# Patient Record
Sex: Female | Born: 1965 | Hispanic: Yes | Marital: Single | Smoking: Former smoker
Health system: Southern US, Community
[De-identification: ages and names within clinical notes are randomized; demographics above are authoritative.]

## PROBLEM LIST (undated history)

## (undated) DIAGNOSIS — K219 Gastro-esophageal reflux disease without esophagitis: Secondary | ICD-10-CM

## (undated) DIAGNOSIS — F329 Major depressive disorder, single episode, unspecified: Secondary | ICD-10-CM

## (undated) DIAGNOSIS — F900 Attention-deficit hyperactivity disorder, predominantly inattentive type: Secondary | ICD-10-CM

## (undated) DIAGNOSIS — D649 Anemia, unspecified: Secondary | ICD-10-CM

## (undated) DIAGNOSIS — F32A Depression, unspecified: Secondary | ICD-10-CM

## (undated) DIAGNOSIS — K589 Irritable bowel syndrome without diarrhea: Secondary | ICD-10-CM

## (undated) HISTORY — DX: Irritable bowel syndrome, unspecified: K58.9

## (undated) HISTORY — DX: Attention-deficit hyperactivity disorder, predominantly inattentive type: F90.0

## (undated) HISTORY — DX: Gastro-esophageal reflux disease without esophagitis: K21.9

## (undated) HISTORY — DX: Depression, unspecified: F32.A

## (undated) HISTORY — DX: Major depressive disorder, single episode, unspecified: F32.9

## (undated) HISTORY — DX: Anemia, unspecified: D64.9

---

## 1997-05-08 ENCOUNTER — Encounter: Admission: RE | Admit: 1997-05-08 | Discharge: 1997-08-06 | Payer: Self-pay | Admitting: Family Medicine

## 1997-09-02 ENCOUNTER — Other Ambulatory Visit: Admission: RE | Admit: 1997-09-02 | Discharge: 1997-09-02 | Payer: Self-pay | Admitting: Obstetrics and Gynecology

## 1998-04-21 ENCOUNTER — Ambulatory Visit (HOSPITAL_BASED_OUTPATIENT_CLINIC_OR_DEPARTMENT_OTHER): Admission: RE | Admit: 1998-04-21 | Discharge: 1998-04-21 | Payer: Self-pay | Admitting: General Surgery

## 1998-10-15 ENCOUNTER — Other Ambulatory Visit: Admission: RE | Admit: 1998-10-15 | Discharge: 1998-10-15 | Payer: Self-pay | Admitting: Obstetrics and Gynecology

## 1998-12-29 ENCOUNTER — Encounter: Admission: RE | Admit: 1998-12-29 | Discharge: 1998-12-29 | Payer: Self-pay | Admitting: Nephrology

## 1998-12-29 ENCOUNTER — Encounter: Payer: Self-pay | Admitting: Obstetrics and Gynecology

## 2000-01-05 ENCOUNTER — Encounter (INDEPENDENT_AMBULATORY_CARE_PROVIDER_SITE_OTHER): Payer: Self-pay | Admitting: *Deleted

## 2000-01-05 ENCOUNTER — Encounter: Admission: RE | Admit: 2000-01-05 | Discharge: 2000-01-05 | Payer: Self-pay | Admitting: Family Medicine

## 2000-01-05 ENCOUNTER — Encounter: Payer: Self-pay | Admitting: Family Medicine

## 2000-04-06 ENCOUNTER — Encounter: Admission: RE | Admit: 2000-04-06 | Discharge: 2000-04-06 | Payer: Self-pay | Admitting: Obstetrics and Gynecology

## 2000-04-06 ENCOUNTER — Encounter: Payer: Self-pay | Admitting: Obstetrics and Gynecology

## 2002-02-28 ENCOUNTER — Other Ambulatory Visit: Admission: RE | Admit: 2002-02-28 | Discharge: 2002-02-28 | Payer: Self-pay | Admitting: Obstetrics and Gynecology

## 2003-03-13 ENCOUNTER — Other Ambulatory Visit: Admission: RE | Admit: 2003-03-13 | Discharge: 2003-03-13 | Payer: Self-pay | Admitting: Obstetrics and Gynecology

## 2003-04-11 ENCOUNTER — Ambulatory Visit (HOSPITAL_COMMUNITY): Admission: RE | Admit: 2003-04-11 | Discharge: 2003-04-11 | Payer: Self-pay | Admitting: Obstetrics and Gynecology

## 2003-04-11 ENCOUNTER — Encounter (INDEPENDENT_AMBULATORY_CARE_PROVIDER_SITE_OTHER): Payer: Self-pay | Admitting: Specialist

## 2003-12-17 ENCOUNTER — Encounter (INDEPENDENT_AMBULATORY_CARE_PROVIDER_SITE_OTHER): Payer: Self-pay | Admitting: *Deleted

## 2004-04-13 ENCOUNTER — Other Ambulatory Visit: Admission: RE | Admit: 2004-04-13 | Discharge: 2004-04-13 | Payer: Self-pay | Admitting: Obstetrics and Gynecology

## 2004-05-12 ENCOUNTER — Ambulatory Visit: Payer: Self-pay | Admitting: Internal Medicine

## 2004-05-14 ENCOUNTER — Ambulatory Visit: Payer: Self-pay | Admitting: Internal Medicine

## 2005-08-31 ENCOUNTER — Encounter: Admission: RE | Admit: 2005-08-31 | Discharge: 2005-08-31 | Payer: Self-pay | Admitting: Neurosurgery

## 2006-04-14 ENCOUNTER — Encounter (INDEPENDENT_AMBULATORY_CARE_PROVIDER_SITE_OTHER): Payer: Self-pay | Admitting: *Deleted

## 2008-08-08 ENCOUNTER — Encounter: Admission: RE | Admit: 2008-08-08 | Discharge: 2008-08-08 | Payer: Self-pay | Admitting: Obstetrics and Gynecology

## 2008-08-14 ENCOUNTER — Encounter: Admission: RE | Admit: 2008-08-14 | Discharge: 2008-08-14 | Payer: Self-pay | Admitting: Neurology

## 2008-10-03 DIAGNOSIS — K649 Unspecified hemorrhoids: Secondary | ICD-10-CM | POA: Insufficient documentation

## 2008-10-09 ENCOUNTER — Ambulatory Visit: Payer: Self-pay | Admitting: Internal Medicine

## 2008-10-09 DIAGNOSIS — R141 Gas pain: Secondary | ICD-10-CM | POA: Insufficient documentation

## 2008-10-09 DIAGNOSIS — R1011 Right upper quadrant pain: Secondary | ICD-10-CM | POA: Insufficient documentation

## 2008-10-09 DIAGNOSIS — K219 Gastro-esophageal reflux disease without esophagitis: Secondary | ICD-10-CM | POA: Insufficient documentation

## 2008-10-09 DIAGNOSIS — R142 Eructation: Secondary | ICD-10-CM

## 2008-10-09 DIAGNOSIS — K625 Hemorrhage of anus and rectum: Secondary | ICD-10-CM | POA: Insufficient documentation

## 2008-10-09 DIAGNOSIS — R143 Flatulence: Secondary | ICD-10-CM

## 2008-10-10 LAB — CONVERTED CEMR LAB
Basophils Absolute: 0.1 10*3/uL (ref 0.0–0.1)
Eosinophils Absolute: 0.1 10*3/uL (ref 0.0–0.7)
HCT: 40 % (ref 36.0–46.0)
Iron: 57 ug/dL (ref 42–145)
Lymphs Abs: 2 10*3/uL (ref 0.7–4.0)
MCHC: 34 g/dL (ref 30.0–36.0)
MCV: 98.9 fL (ref 78.0–100.0)
Monocytes Absolute: 0.6 10*3/uL (ref 0.1–1.0)
Neutro Abs: 5.6 10*3/uL (ref 1.4–7.7)
Platelets: 244 10*3/uL (ref 150.0–400.0)
RDW: 13.1 % (ref 11.5–14.6)

## 2010-01-11 ENCOUNTER — Telehealth: Payer: Self-pay | Admitting: Internal Medicine

## 2010-01-21 ENCOUNTER — Ambulatory Visit: Payer: Self-pay | Admitting: Internal Medicine

## 2010-01-21 DIAGNOSIS — K6289 Other specified diseases of anus and rectum: Secondary | ICD-10-CM

## 2010-02-27 ENCOUNTER — Encounter (HOSPITAL_BASED_OUTPATIENT_CLINIC_OR_DEPARTMENT_OTHER): Payer: Self-pay | Admitting: General Surgery

## 2010-03-01 ENCOUNTER — Encounter: Payer: Self-pay | Admitting: Obstetrics and Gynecology

## 2010-03-09 NOTE — Progress Notes (Signed)
Summary: Medication Refill  Phone Note Call from Patient Call back at [phone]   Caller: Patient Call For: Dr. Juanda Chance Reason for Call: Refill Medication Summary of Call:  pt needs refill for anucort called in to the [facility] on [facility] Initial call taken by: [place] Johnson,  January 11, 2010 10:57 AM  Follow-up for Phone Call        Patient is scheduled for appointment 01/13/10 @ 8:30. I will send her #12 suppositories until her visit. Follow-up by: Lamona Curl CMA Duncan Dull),  January 11, 2010 12:04 PM    Prescriptions: ANUSOL-HC 25 MG SUPP (HYDROCORTISONE ACETATE) Insert 1 suppository into rectum at bedtime  #12 x 0   Entered by:   Lamona Curl CMA (AAMA)   Authorized by:   Hart Carwin MD   Signed by:   Lamona Curl CMA (AAMA) on 01/11/2010   Method used:   Electronically to        [facility] [address] (retail)       [address]       [place], [place]  [address]       Ph: [phone] or [phone]       Fax: [phone]   RxID:   [id number]

## 2010-03-11 NOTE — Assessment & Plan Note (Signed)
Summary: rectal bleeding/dns   History of Present Illness Visit Type: Follow-up Visit Primary GI MD: Lina Sar MD Primary Ranvir Renovato: Alwyn Pea, MD Requesting Danae Oland: na Chief Complaint: Pt c/o BRB in stool after BMs and when she wipes after BMs, recatal pain and denies any other GI complaints  History of Present Illness:   This is a 45 year old female with symptomatic hemorrhoids, rectal bleeding and rectal pain. She takes Anusol suppositories with good response. She comes for refills of her medications. A colonoscopy in 2000 and again in 2006 showed melanosis coli and internal hemorrhoids. She uses a high-fiber diet for constipation.   GI Review of Systems      Denies abdominal pain, acid reflux, belching, bloating, chest pain, dysphagia with liquids, dysphagia with solids, heartburn, loss of appetite, nausea, vomiting, vomiting blood, weight loss, and  weight gain.      Reports rectal bleeding and  rectal pain.     Denies anal fissure, black tarry stools, change in bowel habit, constipation, diarrhea, diverticulosis, fecal incontinence, heme positive stool, hemorrhoids, irritable bowel syndrome, jaundice, light color stool, and  liver problems.    Current Medications (verified): 1)  Strattera 80 Mg Caps (Atomoxetine Hcl) .... One Tablet By Mouth Once Daily 2)  Anusol-Hc 25 Mg Supp (Hydrocortisone Acetate) .... Insert 1 Suppository Into Rectum At Bedtime  Allergies (verified): No Known Drug Allergies  Past History:  Past Medical History: RECTAL PAIN (ICD-569.42) RUQ PAIN (ICD-789.01) ABDOMINAL BLOATING (ICD-787.3) RECTAL BLEEDING (ICD-569.3) GERD (ICD-530.81) HEMORRHOIDS (ICD-455.6)  Past Surgical History: Reviewed history from 10/03/2008 and no changes required. Tubal Ligation  Family History: Reviewed history from 10/09/2008 and no changes required. No FH of Colon Cancer: Family History of Diabetes: Mother  Family History of Irritable Bowel Syndrome:Mother    Social History: [employer] [place]  Married Patient currently smokes.  Alcohol Use - no Daily Caffeine Use: 3 daily  Illicit Drug Use - no Patient does not get regular exercise.   Review of Systems  The patient denies allergy/sinus, anemia, anxiety-new, arthritis/joint pain, back pain, blood in urine, breast changes/lumps, change in vision, confusion, cough, coughing up blood, depression-new, fainting, fatigue, fever, headaches-new, hearing problems, heart murmur, heart rhythm changes, itching, menstrual pain, muscle pains/cramps, night sweats, nosebleeds, pregnancy symptoms, shortness of breath, skin rash, sleeping problems, sore throat, swelling of feet/legs, swollen lymph glands, thirst - excessive , urination - excessive , urination changes/pain, urine leakage, vision changes, and voice change.         Pertinent positive and negative review of systems were noted in the above HPI. All other ROS was otherwise negative.   Vital Signs:  Patient profile:   45 year old female Height:      62.5 inches Weight:      133 pounds BMI:     24.02 BSA:     1.62 Pulse rate:   88 / minute Pulse rhythm:   regular BP sitting:   110 / 64  (left arm) Cuff size:   regular  Vitals Entered By: Ok Anis CMA (January 21, 2010 4:16 PM)  Physical Exam  General:  Well developed, well nourished, no acute distress. Mouth:  No deformity or lesions, dentition normal. Neck:  Supple; no masses or thyromegaly. Lungs:  Clear throughout to auscultation. Heart:  Regular rate and rhythm; no murmurs, rubs,  or bruits. Abdomen:  Soft, nontender and nondistended. No masses, hepatosplenomegaly or hernias noted. Normal bowel sounds. Rectal:  rectal and anoscopic exam reveals first-grade external hemorrhoids. Titratable sphincter tone, first-grade internal  hemorrhoids somewhat edematous but no bleeding. Stool is Hemoccult negative. Extremities:  No clubbing, cyanosis, edema or deformities noted. Skin:  Intact  without significant lesions or rashes. Psych:  Alert and cooperative. Normal mood and affect.   Impression & Recommendations:  Problem # 1:  RECTAL PAIN (NWG-956.21) Patient has rectal pain and bleeding due to external and internal hemorrhoids. We will refill Anusol-HC suppositories and Analpram cream 2.5%. We have discussed extensively medical versus surgical treatment. She prefers to continue medical treatment which will include a high-fiber diet and fiber supplements.  Problem # 2:  RECTAL BLEEDING (ICD-569.3) This occurs secondary to hemorrhoids. A recall colonoscopy will be due in 2016.  Patient Instructions: 1)  Please pick up your prescriptions at the pharmacy. Electronic prescription(s) has already been sent for Anusol Suppositories and Analpram Cream. 2)  A recall colonoscopy will be due in 2016. 3)  Please schedule a follow-up appointment in 2 years. 4)  Copy sent to : Alwyn Pea, MD 5)  The medication list was reviewed and reconciled.  All changed / newly prescribed medications were explained.  A complete medication list was provided to the patient / caregiver. Prescriptions: ANALPRAM-HC 1-2.5 % CREA (HYDROCORTISONE ACE-PRAMOXINE) Apply to rectum 2-3 times daily  #30 grams x 10   Entered by:   Lamona Curl CMA (AAMA)   Authorized by:   Hart Carwin MD   Signed by:   Lamona Curl CMA (AAMA) on 01/21/2010   Method used:   Electronically to        [facility] [address] (retail)       [address]       [place], [place]  [address]       Ph: [phone] or [phone]       Fax: [phone]   RxID:   [id number] ANUSOL-HC 25 MG SUPP (HYDROCORTISONE ACETATE) Insert 1 suppository into rectum at bedtime  #12 x 10   Entered by:   Lamona Curl CMA (AAMA)   Authorized by:   Hart Carwin MD   Signed by:   Lamona Curl CMA (AAMA) on 01/21/2010   Method used:   Electronically to        [facility] [address]  (retail)       [address]       [place], [place]  [address]       Ph: [phone] or [phone]       Fax: [phone]   RxID:   [id number]

## 2010-06-25 NOTE — Op Note (Signed)
NAME:  Natasha Arias, Natasha Arias                            ACCOUNT NO.:  [id number]   MEDICAL RECORD NO.:  [id number]                   PATIENT TYPE:  AMB   LOCATION:  SDC                                  FACILITY:  [facility]   PHYSICIAN:  Duke Salvia. Marcelle Overlie, M.D.            DATE OF BIRTH:  Nov 02, 1965   DATE OF PROCEDURE:  04/11/2003  DATE OF DISCHARGE:                                 OPERATIVE REPORT   PREOPERATIVE DIAGNOSIS:  Abnormal uterine bleeding.   POSTOPERATIVE DIAGNOSIS:  Abnormal uterine bleeding.   PROCEDURES:  1. Dilatation and curettage.  2. Novasure endometrial ablation.   SURGEON:  Duke Salvia. Marcelle Overlie, M.D.   ANESTHESIA:  Paracervical block plus general mask.   ESTIMATED BLOOD LOSS:  Less than 5 mL.   PROCEDURE AND FINDINGS:  The patient was taken to the operating room.  After  an adequate level of general mask anesthesia was obtained with the patient's  legs and stirrups, the perineum and vagina were prepped and draped with  Betadine.  The bladder was drained, EUA was carried out.  The uterus was  normal size, midposition, adnexa negative.  The speculum was positioned,  cervix grasped with a tenaculum.  Paracervical block was then created by  infiltrating at 3 and 9 o'clock submucosally.  Five to seven milliliters of  1% Xylocaine on either side after negative aspiration.  The uterus was then  sounded to 8.5 cm.  The cervical length estimate was at 3 cm.  After  dilating to a 29 Pratt dilator, a D&C was carried out.  A small amount of  tissue was removed and sent to pathology.  There were no palpable  abnormalities, and her prior ultrasound had been normal.  The Novasure  ablation was then carried out per protocol with the following settings:  The  cavity length of 5.5, a width of 4.4, with 133 watts of power at 92 seconds,  without complications.  The Novasure instrument was removed.  She tolerated  this well.  Before the ablation was carried out, the Novasure protocol was  carried out, including the CO2 test, which was passed.  She went to the  recovery room in good condition.                                               Richard M. Marcelle Overlie, M.D.    RMH/MEDQ  D:  04/11/2003  T:  04/11/2003  Job:  [id number]

## 2010-06-25 NOTE — H&P (Signed)
NAME:  Natasha Arias, FELBER                            ACCOUNT NO.:  [id number]   MEDICAL RECORD NO.:  [id number]                   PATIENT TYPE:  AMB   LOCATION:  SDC                                  [facility]ILITY:  [facility]   PHYSICIAN:  Duke Salvia. Marcelle Overlie, M.D.            DATE OF BIRTH:  07/04/1965   DATE OF ADMISSION:  04/11/2003  DATE OF DISCHARGE:                                HISTORY & PHYSICAL   CHIEF COMPLAINT:  Menorrhagia.   HISTORY OF PRESENT ILLNESS:  A 45 year old G3, P2, prior tubal who presents  for [facility] hysteroscopy and NovaSure endometrial ablation to treat problematic  menorrhagia that has not responded to more conservative measures.  Her last  ultrasound in the office was in 2000 that showed a normal sized uterus with  no abnormalities.   ALLERGIES:  No known drug allergies.   CURRENT MEDICATIONS:  Zoloft.   REVIEW OF SYMPTOMS:  Significant for past history of smoking.  She does not  currently smoke.  Father has a history of colon cancer.  The patient has  otherwise not had any medical illnesses.   PHYSICAL EXAMINATION:  VITAL SIGNS:  Temperature 98.2, blood pressure  120/72.  HEENT:  Unremarkable.  NECK:  Supple without masses.  LUNGS:  Clear.  CARDIOVASCULAR:  Regular rate and rhythm without murmurs, rubs, or gallops.  BREASTS:  Without masses.  ABDOMEN:  Soft, flat and nontender.  PELVIC:  Normal external genitalia, vagina and cervix clear.  Uterus sounded  anterior normal size.  Adnexa negative.  EXTREMITIES:  Unremarkable.  NEUROLOGIC:  Unremarkable.   IMPRESSION:  Continued menorrhagia despite conservative treatment.   PLAN:  D&C, hysteroscopy, NovaSure endometrial ablation.  This procedure  including risks of bleeding, infection, other complications such as  perforation that may require additional surgery were reviewed with her.  They expect results from the ablation including 30 to 40% amenorrhea rate,  90+% hypomenorrhea rate reviewed with her, all of which she  understands and  accepts.                                               Richard M. Marcelle Overlie, M.D.    RMH/MEDQ  D:  04/08/2003  T:  04/08/2003  Job:  [id number]

## 2012-11-14 LAB — BASIC METABOLIC PANEL
BUN: 10 mg/dL (ref 4–21)
CREATININE: 0.9 mg/dL (ref 0.5–1.1)
Glucose: 83 mg/dL
Potassium: 4.4 mmol/L (ref 3.4–5.3)
Sodium: 142 mmol/L (ref 137–147)

## 2012-11-14 LAB — HEPATIC FUNCTION PANEL
ALT: 25 U/L (ref 7–35)
AST: 24 U/L (ref 13–35)
Alkaline Phosphatase: 75 U/L (ref 25–125)
BILIRUBIN, TOTAL: 0.7 mg/dL

## 2012-11-14 LAB — LIPID PANEL
CHOLESTEROL: 192 mg/dL (ref 0–200)
HDL: 51 mg/dL (ref 35–70)
LDL CALC: 123 mg/dL
LDL/HDL RATIO: 2.4
TRIGLYCERIDES: 150 mg/dL (ref 40–160)

## 2012-11-14 LAB — CBC AND DIFFERENTIAL
HEMATOCRIT: 42 % (ref 36–46)
HEMOGLOBIN: 14.3 g/dL (ref 12.0–16.0)
PLATELETS: 295 10*3/uL (ref 150–399)
WBC: 6.7 10^3/mL

## 2012-11-14 LAB — TSH: TSH: 1.11 u[IU]/mL (ref 0.41–5.90)

## 2013-08-20 ENCOUNTER — Encounter: Payer: Self-pay | Admitting: General Practice

## 2013-10-24 ENCOUNTER — Ambulatory Visit: Payer: Self-pay | Admitting: Family Medicine

## 2014-04-09 ENCOUNTER — Encounter: Payer: Self-pay | Admitting: Internal Medicine

## 2014-10-30 ENCOUNTER — Encounter: Payer: Self-pay | Admitting: Internal Medicine

## 2016-07-20 ENCOUNTER — Encounter: Payer: Self-pay | Admitting: Family Medicine

## 2016-12-09 ENCOUNTER — Other Ambulatory Visit: Payer: Self-pay | Admitting: Obstetrics and Gynecology

## 2016-12-09 DIAGNOSIS — R928 Other abnormal and inconclusive findings on diagnostic imaging of breast: Secondary | ICD-10-CM

## 2016-12-23 ENCOUNTER — Ambulatory Visit
Admission: RE | Admit: 2016-12-23 | Discharge: 2016-12-23 | Disposition: A | Discharge status: Home / Self Care | Payer: 59 | Source: Ambulatory Visit | Attending: Obstetrics and Gynecology | Admitting: Obstetrics and Gynecology

## 2016-12-23 ENCOUNTER — Ambulatory Visit: Payer: Self-pay

## 2016-12-23 DIAGNOSIS — R928 Other abnormal and inconclusive findings on diagnostic imaging of breast: Secondary | ICD-10-CM

## 2018-01-12 ENCOUNTER — Encounter: Payer: Self-pay | Admitting: Family Medicine

## 2018-01-12 ENCOUNTER — Ambulatory Visit (INDEPENDENT_AMBULATORY_CARE_PROVIDER_SITE_OTHER): Payer: 59 | Admitting: Family Medicine

## 2018-01-12 ENCOUNTER — Other Ambulatory Visit: Payer: Self-pay

## 2018-01-12 ENCOUNTER — Ambulatory Visit (INDEPENDENT_AMBULATORY_CARE_PROVIDER_SITE_OTHER): Payer: 59

## 2018-01-12 VITALS — BP 135/81 | HR 77 | Temp 98.5°F | Ht 63.0 in | Wt 210.2 lb

## 2018-01-12 DIAGNOSIS — R635 Abnormal weight gain: Secondary | ICD-10-CM | POA: Diagnosis not present

## 2018-01-12 DIAGNOSIS — Z13220 Encounter for screening for lipoid disorders: Secondary | ICD-10-CM

## 2018-01-12 DIAGNOSIS — R0609 Other forms of dyspnea: Secondary | ICD-10-CM | POA: Diagnosis not present

## 2018-01-12 DIAGNOSIS — R600 Localized edema: Secondary | ICD-10-CM

## 2018-01-12 DIAGNOSIS — R5383 Other fatigue: Secondary | ICD-10-CM

## 2018-01-12 DIAGNOSIS — R079 Chest pain, unspecified: Secondary | ICD-10-CM

## 2018-01-12 NOTE — Progress Notes (Signed)
12/6/201911:18 AM  Natasha LinemanMaria Milagro Arias December 27, 1965, 52 y.o. female [id number]  Chief Complaint  Patient presents with  . Foot Swelling    feet swelling for some months, causing some pain in the ankle and bottom of foot. Taking no meds for the pain. Uses topical pain releiver. Stopped smoking 3 months ago, started on chantix, thats when swelling was noticed and weight gain of 30-40lbs    HPI:   Patient is a 52 y.o. female who presents today for swelling of feet  Patient is concerned because her mother had afib and CVA Patient stopped smoking about 3 months ago Has gained about 45 lbs in about 9 months Both leg swelling, wakes up ok but then increases with the day + DOE, occ CP, LEFT sided, has not paid much attention to it Has chronic rare palpitations, not worse of recent Started HRT about 30 days ago, has a bit more energy No nausea, vomiting, has had a bit of abd pain, normal BM Having nocturia of recent but no dysuria, increased thirst Eating lots of fast food Not exercising Several years ago used to be on wellbutrin for seasonal affective, did not like wellbutrin at all.    Fall Risk  01/12/2018  Falls in the past year? 0     Depression screen PHQ 2/9 01/12/2018  Decreased Interest 0  Down, Depressed, Hopeless 0  PHQ - 2 Score 0    Allergies not on file  Prior to Admission medications   Medication Sig Start Date End Date Taking? Authorizing Provider  Estradiol-Progesterone (BIJUVA) 1-100 MG CAPS Take by mouth.   Yes [provider]    Past Medical History:  Diagnosis Date  . Anemia   . Depression     History reviewed. No pertinent surgical history.  Social History   Tobacco Use  . Smoking status: Former Smoker    Types: Cigarettes    Last attempt to quit: 09/12/2017    Years since quitting: 0.3  . Smokeless tobacco: Never Used  Substance Use Topics  . Alcohol use: Never    Frequency: Never    Family History  Problem Relation Age of Onset    . Diabetes Mother   . Heart disease Mother   . Hyperlipidemia Mother   . Hypertension Mother   . Diabetes Father   . Heart disease Father   . Hyperlipidemia Father   . Mental illness Father     ROS Per hpi  OBJECTIVE:  Blood pressure 135/81, pulse 77, temperature 98.5 F (36.9 C), temperature source Oral, height 5\' 3"  (1.6 m), weight 210 lb 3.2 oz (95.3 kg), SpO2 97 %. Body mass index is 37.24 kg/m.    Physical Exam  Constitutional: She is oriented to person, place, and time. She appears well-developed and well-nourished.  HENT:  Head: Normocephalic and atraumatic.  Right Ear: Hearing, tympanic membrane, external ear and ear canal normal.  Left Ear: Hearing, tympanic membrane, external ear and ear canal normal.  Mouth/Throat: Oropharynx is clear and moist.  Eyes: Pupils are equal, round, and reactive to light. Conjunctivae and EOM are normal.  Neck: Neck supple. No thyromegaly present.  Cardiovascular: Normal rate, regular rhythm, normal heart sounds and intact distal pulses. Exam reveals no gallop and no friction rub.  No murmur heard. Pulmonary/Chest: Effort normal and breath sounds normal. She has no wheezes. She has no rales.  Abdominal: Soft. Bowel sounds are normal. She exhibits no distension and no mass. There is no hepatosplenomegaly. There is generalized tenderness.  Musculoskeletal: Normal range of motion. She exhibits edema (pitting, +1, upto knees, bilateral).  Lymphadenopathy:    She has no cervical adenopathy.  Neurological: She is alert and oriented to person, place, and time. She has normal reflexes. No cranial nerve deficit. Gait normal.  Skin: Skin is warm and dry.  Psychiatric: She has a normal mood and affect.  Nursing note and vitals reviewed.  Dg Chest 2 View  Result Date: 01/12/2018 CLINICAL DATA:  Acute shortness of breath EXAM: CHEST - 2 VIEW COMPARISON:  None. FINDINGS: The cardiomediastinal silhouette is unremarkable. There is no evidence of  focal airspace disease, pulmonary edema, suspicious pulmonary nodule/mass, pleural effusion, or pneumothorax. No acute bony abnormalities are identified. IMPRESSION: No active cardiopulmonary disease. Electronically Signed   By: Harmon Pier M.D.   On: 01/12/2018 11:53   My interpretation of EKG:  NSR, HR 63, no st changes   ASSESSMENT and PLAN  1. DOE (dyspnea on exertion) Exam and testing reassuring. Discussed most likely from recent unhealthy lifestyle. discussed use of OTC compression stockings. Discussed importance of healthy LFM. ER precautions given  - Brain natriuretic peptide - DG Chest 2 View; Future - Urinalysis, dipstick only  2. Bilateral leg edema - Brain natriuretic peptide - DG Chest 2 View; Future - Urinalysis, dipstick only  3. Weight gain - Comprehensive metabolic panel - Lipid panel - Brain natriuretic peptide - Urinalysis, dipstick only  4. Fatigue, unspecified type - CBC - TSH - Urinalysis, dipstick only  5. Chest pain, unspecified type - Lipid panel - EKG 12-Lead  6. Screening for lipid disorders - Lipid panel   Return in about 1 week (around 01/19/2018) for followup on labs.    [name], MD Primary Care at [facility] [address] [place], [place] [address] Ph.  [phone] Fax [phone]

## 2018-01-12 NOTE — Patient Instructions (Signed)
° ° ° °  If you have lab work done today you will be contacted with your lab results within the next 2 weeks.  If you have not heard from us then please contact us. The fastest way to get your results is to register for My Chart. ° ° °IF you received an x-ray today, you will receive an invoice from [facility]. Please contact [facility] at [phone] with questions or concerns regarding your invoice.  ° °IF you received labwork today, you will receive an invoice from [facility]. Please contact [facility] at [phone] with questions or concerns regarding your invoice.  ° °Our billing staff will not be able to assist you with questions regarding bills from these companies. ° °You will be contacted with the lab results as soon as they are available. The fastest way to get your results is to activate your My Chart account. Instructions are located on the last page of this paperwork. If you have not heard from us regarding the results in 2 weeks, please contact this office. °  ° ° ° °

## 2018-01-13 LAB — COMPREHENSIVE METABOLIC PANEL
ALT: 37 IU/L — ABNORMAL HIGH (ref 0–32)
AST: 34 IU/L (ref 0–40)
Albumin/Globulin Ratio: 1.8 (ref 1.2–2.2)
Albumin: 4.4 g/dL (ref 3.5–5.5)
Alkaline Phosphatase: 108 IU/L (ref 39–117)
BUN/Creatinine Ratio: 16 (ref 9–23)
BUN: 14 mg/dL (ref 6–24)
Bilirubin Total: 0.6 mg/dL (ref 0.0–1.2)
CO2: 23 mmol/L (ref 20–29)
Calcium: 9.9 mg/dL (ref 8.7–10.2)
Chloride: 100 mmol/L (ref 96–106)
Creatinine, Ser: 0.86 mg/dL (ref 0.57–1.00)
GFR calc Af Amer: 89 mL/min/{1.73_m2} (ref >59)
GFR calc non Af Amer: 77 mL/min/{1.73_m2} (ref >59)
Globulin, Total: 2.4 g/dL (ref 1.5–4.5)
Glucose: 79 mg/dL (ref 65–99)
Potassium: 4.1 mmol/L (ref 3.5–5.2)
Sodium: 139 mmol/L (ref 134–144)
Total Protein: 6.8 g/dL (ref 6.0–8.5)

## 2018-01-13 LAB — URINALYSIS, DIPSTICK ONLY
Bilirubin, UA: NEGATIVE
Glucose, UA: NEGATIVE
Ketones, UA: NEGATIVE
Leukocytes, UA: NEGATIVE
Nitrite, UA: NEGATIVE
Protein, UA: NEGATIVE
RBC, UA: NEGATIVE
Specific Gravity, UA: 1.008 (ref 1.005–1.030)
Urobilinogen, Ur: 0.2 mg/dL (ref 0.2–1.0)
pH, UA: 6.5 (ref 5.0–7.5)

## 2018-01-13 LAB — LIPID PANEL
Chol/HDL Ratio: 4.3 ratio (ref 0.0–4.4)
Cholesterol, Total: 209 mg/dL — ABNORMAL HIGH (ref 100–199)
HDL: 49 mg/dL (ref >39)
LDL Calculated: 85 mg/dL (ref 0–99)
Triglycerides: 377 mg/dL — ABNORMAL HIGH (ref 0–149)
VLDL Cholesterol Cal: 75 mg/dL — ABNORMAL HIGH (ref 5–40)

## 2018-01-13 LAB — CBC
Hematocrit: 35.8 % (ref 34.0–46.6)
Hemoglobin: 12.2 g/dL (ref 11.1–15.9)
MCH: 31.4 pg (ref 26.6–33.0)
MCHC: 34.1 g/dL (ref 31.5–35.7)
MCV: 92 fL (ref 79–97)
Platelets: 338 10*3/uL (ref 150–450)
RBC: 3.89 x10E6/uL (ref 3.77–5.28)
RDW: 13.1 % (ref 12.3–15.4)
WBC: 7.3 10*3/uL (ref 3.4–10.8)

## 2018-01-13 LAB — TSH: TSH: 1.89 u[IU]/mL (ref 0.450–4.500)

## 2018-01-13 LAB — BRAIN NATRIURETIC PEPTIDE: BNP: 11.1 pg/mL (ref 0.0–100.0)

## 2018-01-15 ENCOUNTER — Encounter: Payer: Self-pay | Admitting: Family Medicine

## 2018-01-24 ENCOUNTER — Encounter: Payer: Self-pay | Admitting: Family Medicine

## 2018-01-24 ENCOUNTER — Other Ambulatory Visit: Payer: Self-pay

## 2018-01-24 ENCOUNTER — Ambulatory Visit (INDEPENDENT_AMBULATORY_CARE_PROVIDER_SITE_OTHER): Payer: 59 | Admitting: Family Medicine

## 2018-01-24 VITALS — BP 119/79 | HR 72 | Temp 98.3°F | Resp 14 | Ht 63.0 in | Wt 210.6 lb

## 2018-01-24 DIAGNOSIS — R945 Abnormal results of liver function studies: Secondary | ICD-10-CM | POA: Diagnosis not present

## 2018-01-24 DIAGNOSIS — Z6837 Body mass index (BMI) 37.0-37.9, adult: Secondary | ICD-10-CM

## 2018-01-24 DIAGNOSIS — R7989 Other specified abnormal findings of blood chemistry: Secondary | ICD-10-CM

## 2018-01-24 NOTE — Patient Instructions (Addendum)
If you have lab work done today you will be contacted with your lab results within the next 2 weeks.  If you have not heard from [place] then please contact us. The fastest way to get your results is to register for My Chart.   IF you received an x-ray today, you will receive an invoice from [facility]. Please contact [facility] at [phone] with questions or concerns regarding your invoice.   IF you received labwork today, you will receive an invoice from [facility]. Please contact [facility] at [phone] with questions or concerns regarding your invoice.   Our billing staff will not be able to assist you with questions regarding bills from these companies.  You will be contacted with the lab results as soon as they are available. The fastest way to get your results is to activate your My Chart account. Instructions are located on the last page of this paperwork. If you have not heard from [place] regarding the results in 2 weeks, please contact this office.      Calorie Counting for Weight Loss Calories are units of energy. Your body needs a certain amount of calories from food to keep you going throughout the day. When you eat more calories than your body needs, your body stores the extra calories as fat. When you eat fewer calories than your body needs, your body burns fat to get the energy it needs. Calorie counting means keeping track of how many calories you eat and drink each day. Calorie counting can be helpful if you need to lose weight. If you make sure to eat fewer calories than your body needs, you should lose weight. Ask your health care provider what a healthy weight is for you. For calorie counting to work, you will need to eat the right number of calories in a day in order to lose a healthy amount of weight per week. A dietitian can help you determine how many calories you need in a day and will give you suggestions on how to reach your calorie goal.  A healthy  amount of weight to lose per week is usually 1-2 lb (0.5-0.9 kg). This usually means that your daily calorie intake should be reduced by 500-750 calories.  Eating 1,200 - 1,500 calories per day can help most women lose weight.  Eating 1,500 - 1,800 calories per day can help most men lose weight. What is my plan? My goal is to have _____1500_____ calories per day. If I have this many calories per day, I should lose around _____10_____ pounds in 3 months What do I need to know about calorie counting? In order to meet your daily calorie goal, you will need to:  Find out how many calories are in each food you would like to eat. Try to do this before you eat.  Decide how much of the food you plan to eat.  Write down what you ate and how many calories it had. Doing this is called keeping a food log. To successfully lose weight, it is important to balance calorie counting with a healthy lifestyle that includes regular activity. Aim for 150 minutes of moderate exercise (such as walking) or 75 minutes of vigorous exercise (such as running) each week. Where do I find calorie information?  The number of calories in a food can be found on a Nutrition Facts label. If a food does not have a Nutrition Facts label, try to look up the calories online or ask your  for help. Remember that calories are listed per serving. If you choose to have more than one serving of a food, you will have to multiply the calories per serving by the amount of servings you plan to eat. For example, the label on a package of bread might say that a serving size is 1 slice and that there are 90 calories in a serving. If you eat 1 slice, you will have eaten 90 calories. If you eat 2 slices, you will have eaten 180 calories. How do I keep a food log? Immediately after each meal, record the following information in your food log:  What you ate. Don't forget to include toppings, sauces, and other extras on the food.  How much you  ate. This can be measured in cups, ounces, or number of items.  How many calories each food and drink had.  The total number of calories in the meal. Keep your food log near you, such as in a small notebook in your pocket, or use a mobile app or website. Some programs will calculate calories for you and show you how many calories you have left for the day to meet your goal. What are some calorie counting tips?   Use your calories on foods and drinks that will fill you up and not leave you hungry: ? Some examples of foods that fill you up are nuts and nut butters, vegetables, lean proteins, and high-fiber foods like whole grains. High-fiber foods are foods with more than 5 g fiber per serving. ? Drinks such as sodas, specialty coffee drinks, alcohol, and juices have a lot of calories, yet do not fill you up.  Eat nutritious foods and avoid empty calories. Empty calories are calories you get from foods or beverages that do not have many vitamins or protein, such as candy, sweets, and soda. It is better to have a nutritious high-calorie food (such as an avocado) than a food with few nutrients (such as a bag of chips).  Know how many calories are in the foods you eat most often. This will help you calculate calorie counts faster.  Pay attention to calories in drinks. Low-calorie drinks include water and unsweetened drinks.  Pay attention to nutrition labels for "low fat" or "fat free" foods. These foods sometimes have the same amount of calories or more calories than the full fat versions. They also often have added sugar, starch, or salt, to make up for flavor that was removed with the fat.  Find a way of tracking calories that works for you. Get creative. Try different apps or programs if writing down calories does not work for you. What are some portion control tips?  Know how many calories are in a serving. This will help you know how many servings of a certain food you can have.  Use a  measuring cup to measure serving sizes. You could also try weighing out portions on a kitchen scale. With time, you will be able to estimate serving sizes for some foods.  Take some time to put servings of different foods on your favorite plates, bowls, and cups so you know what a serving looks like.  Try not to eat straight from a bag or box. Doing this can lead to overeating. Put the amount you would like to eat in a cup or on a plate to make sure you are eating the right portion.  Use smaller plates, glasses, and bowls to prevent overeating.  Try not to multitask (for   for example, watch TV or use your computer) while eating. If it is time to eat, sit down at a table and enjoy your food. This will help you to know when you are full. It will also help you to be aware of what you are eating and how much you are eating. What are tips for following this plan? Reading food labels  Check the calorie count compared to the serving size. The serving size may be smaller than what you are used to eating.  Check the source of the calories. Make sure the food you are eating is high in vitamins and protein and low in saturated and trans fats. Shopping  Read nutrition labels while you shop. This will help you make healthy decisions before you decide to purchase your food.  Make a grocery list and stick to it. Cooking  Try to cook your favorite foods in a healthier way. For example, try baking instead of frying.  Use low-fat dairy products. Meal planning  Use more fruits and vegetables. Half of your plate should be fruits and vegetables.  Include lean proteins like poultry and fish. How do I count calories when eating out?  Ask for smaller portion sizes.  Consider sharing an entree and sides instead of getting your own entree.  If you get your own entree, eat only half. Ask for a box at the beginning of your meal and put the rest of your entree in it so you are not tempted to eat it.  If calories  are listed on the menu, choose the lower calorie options.  Choose dishes that include vegetables, fruits, whole grains, low-fat dairy products, and lean protein.  Choose items that are boiled, broiled, grilled, or steamed. Stay away from items that are buttered, battered, fried, or served with cream sauce. Items labeled "crispy" are usually fried, unless stated otherwise.  Choose water, low-fat milk, unsweetened iced tea, or other drinks without added sugar. If you want an alcoholic beverage, choose a lower calorie option such as a glass of wine or light beer.  Ask for dressings, sauces, and syrups on the side. These are usually high in calories, so you should limit the amount you eat.  If you want a salad, choose a garden salad and ask for grilled meats. Avoid extra toppings like bacon, cheese, or fried items. Ask for the dressing on the side, or ask for olive oil and vinegar or lemon to use as dressing.  Estimate how many servings of a food you are given. For example, a serving of cooked rice is  cup or about the size of half a baseball. Knowing serving sizes will help you be aware of how much food you are eating at restaurants. The list below tells you how big or small some common portion sizes are based on everyday objects: ? 1 oz-4 stacked dice. ? 3 oz-1 deck of cards. ? 1 tsp-1 die. ? 1 Tbsp- a ping-pong ball. ? 2 Tbsp-1 ping-pong ball. ?  cup- baseball. ? 1 cup-1 baseball. Summary  Calorie counting means keeping track of how many calories you eat and drink each day. If you eat fewer calories than your body needs, you should lose weight.  A healthy amount of weight to lose per week is usually 1-2 lb (0.5-0.9 kg). This usually means reducing your daily calorie intake by 500-750 calories.  The number of calories in a food can be found on a Nutrition Facts label. If a food does not have a  Nutrition Facts label, try to look up the calories online or ask your dietitian for help.  Use  your calories on foods and drinks that will fill you up, and not on foods and drinks that will leave you hungry.  Use smaller plates, glasses, and bowls to prevent overeating. This information is not intended to replace advice given to you by your health care provider. Make sure you discuss any questions you have with your health care provider. Document Released: 01/24/2005 Document Revised: 10/13/2017 Document Reviewed: 12/25/2015 Elsevier Interactive Patient Education  2019 [employer]

## 2018-01-24 NOTE — Progress Notes (Signed)
12/18/20199:49 AM  Natasha Arias 1965/10/10, 52 y.o. female [id number]  Chief Complaint  Patient presents with  . Follow-up    to go over lab results that were done on 01/13/18    HPI:   Patient is a 52 y.o. female who presents today for followup on labs seens about 3 weeks ago Concern for leg edema, DOE, in setting of 45 lbs weight gain She had normal CXR and ekg, exam was reassuring  No results found for: HGBA1C Lab Results  Component Value Date   LDLCALC 85 01/12/2018   CREATININE 0.86 01/12/2018   Lab Results  Component Value Date   NA 139 01/12/2018   K 4.1 01/12/2018   CL 100 01/12/2018   CO2 23 01/12/2018   Lab Results  Component Value Date   TSH 1.890 01/12/2018   Lab Results  Component Value Date   ALT 37 (H) 01/12/2018   AST 34 01/12/2018   ALKPHOS 108 01/12/2018   BILITOT 0.6 01/12/2018   Lab Results  Component Value Date   WBC 7.3 01/12/2018   HGB 12.2 01/12/2018   HCT 35.8 01/12/2018   MCV 92 01/12/2018   PLT 338 01/12/2018    Fall Risk  01/24/2018 01/12/2018  Falls in the past year? 0 0     Depression screen [facility] 2/9 01/24/2018 01/12/2018  Decreased Interest 0 0  Down, Depressed, Hopeless 0 0  PHQ - 2 Score 0 0    No Known Allergies  Prior to Admission medications   Medication Sig Start Date End Date Taking? Authorizing Provider  Estradiol-Progesterone (BIJUVA) 1-100 MG CAPS Take by mouth.   Yes [provider]    Past Medical History:  Diagnosis Date  . ADHD (attention deficit hyperactivity disorder), inattentive type   . Anemia   . Depression   . GERD (gastroesophageal reflux disease)   . IBS (irritable bowel syndrome)     No past surgical history on file.  Social History   Tobacco Use  . Smoking status: Former Smoker    Types: Cigarettes    Last attempt to quit: 09/12/2017    Years since quitting: 0.3  . Smokeless tobacco: Never Used  Substance Use Topics  . Alcohol use: Never    Frequency: Never     Family History  Problem Relation Age of Onset  . Diabetes Mother   . Heart disease Mother   . Hyperlipidemia Mother   . Hypertension Mother   . Diabetes Father   . Heart disease Father   . Hyperlipidemia Father   . Mental illness Father     ROS Per hpi  OBJECTIVE:  Blood pressure 119/79, pulse 72, temperature 98.3 F (36.8 C), temperature source Oral, resp. rate 14, height 5\' 3"  (1.6 m), weight 210 lb 9.6 oz (95.5 kg), SpO2 97 %. Body mass index is 37.31 kg/m.   Wt Readings from Last 3 Encounters:  01/24/18 210 lb 9.6 oz (95.5 kg)  01/12/18 210 lb 3.2 oz (95.3 kg)    Physical Exam Vitals signs and nursing note reviewed.  Constitutional:      Appearance: She is well-developed.  HENT:     Head: Normocephalic and atraumatic.  Eyes:     General: No scleral icterus.    Conjunctiva/sclera: Conjunctivae normal.     Pupils: Pupils are equal, round, and reactive to light.  Neck:     Musculoskeletal: Neck supple.  Pulmonary:     Effort: Pulmonary effort is normal.  Skin:  General: Skin is warm and dry.  Neurological:     Mental Status: She is alert and oriented to person, place, and time.     ASSESSMENT and PLAN  1. Adult BMI 37.0-37.9 kg/sq m 2. Abnormal LFTs Labs overall reassuring. LFT suggestive of fatty liver. Discussed importance of weight loss. Discussed calorie counting and exercise. Provided educational handout  Return in about 6 months (around 07/26/2018) for weight .    Myles LippsIrma M Santiago, MD Primary [facility] at [facility] [address] [place], [place] [address] Ph.  [phone] Fax [phone]

## 2018-03-28 ENCOUNTER — Telehealth: Payer: Self-pay | Admitting: Family Medicine

## 2018-03-28 NOTE — Telephone Encounter (Signed)
MyChart message sent to pt about rescheduling their apt on 07/31/18 due to provider being out of office

## 2018-07-31 ENCOUNTER — Ambulatory Visit: Payer: 59 | Admitting: Family Medicine

## 2019-05-27 ENCOUNTER — Other Ambulatory Visit: Payer: Self-pay | Admitting: Obstetrics and Gynecology

## 2019-05-27 DIAGNOSIS — R928 Other abnormal and inconclusive findings on diagnostic imaging of breast: Secondary | ICD-10-CM

## 2019-06-05 ENCOUNTER — Telehealth: Payer: Self-pay | Admitting: Family Medicine

## 2019-06-05 NOTE — Telephone Encounter (Signed)
LVM for patient to call back and schedule fasting  labs 3-4 days before physical scheduled for 06/25/2019

## 2019-06-06 ENCOUNTER — Ambulatory Visit
Admission: RE | Admit: 2019-06-06 | Discharge: 2019-06-06 | Disposition: A | Discharge status: Home / Self Care | Payer: 59 | Source: Ambulatory Visit | Attending: Obstetrics and Gynecology | Admitting: Obstetrics and Gynecology

## 2019-06-06 ENCOUNTER — Other Ambulatory Visit: Payer: Self-pay

## 2019-06-06 ENCOUNTER — Other Ambulatory Visit: Payer: Self-pay | Admitting: Obstetrics and Gynecology

## 2019-06-06 DIAGNOSIS — R928 Other abnormal and inconclusive findings on diagnostic imaging of breast: Secondary | ICD-10-CM

## 2019-06-07 ENCOUNTER — Other Ambulatory Visit: Payer: Self-pay | Admitting: Emergency Medicine

## 2019-06-07 DIAGNOSIS — R635 Abnormal weight gain: Secondary | ICD-10-CM

## 2019-06-07 DIAGNOSIS — E785 Hyperlipidemia, unspecified: Secondary | ICD-10-CM

## 2019-06-07 DIAGNOSIS — R5383 Other fatigue: Secondary | ICD-10-CM

## 2019-06-07 NOTE — Telephone Encounter (Signed)
Pt called back and sched her lab visit for Monday 06/21/19 needing labs orders in for this date.

## 2019-06-07 NOTE — Telephone Encounter (Signed)
Lab order has been placed.  

## 2019-06-21 ENCOUNTER — Telehealth: Payer: Self-pay

## 2019-06-21 ENCOUNTER — Other Ambulatory Visit: Payer: Self-pay

## 2019-06-21 ENCOUNTER — Ambulatory Visit (INDEPENDENT_AMBULATORY_CARE_PROVIDER_SITE_OTHER): Payer: Commercial Managed Care - PPO | Admitting: Family Medicine

## 2019-06-21 DIAGNOSIS — R5383 Other fatigue: Secondary | ICD-10-CM

## 2019-06-21 DIAGNOSIS — R635 Abnormal weight gain: Secondary | ICD-10-CM

## 2019-06-21 DIAGNOSIS — E785 Hyperlipidemia, unspecified: Secondary | ICD-10-CM

## 2019-06-21 NOTE — Telephone Encounter (Signed)
Pt came in for blood draw for cpe

## 2019-06-21 NOTE — Progress Notes (Signed)
Lab only visit 

## 2019-06-22 LAB — COMPREHENSIVE METABOLIC PANEL
ALT: 41 IU/L — ABNORMAL HIGH (ref 0–32)
AST: 31 IU/L (ref 0–40)
Albumin/Globulin Ratio: 1.5 (ref 1.2–2.2)
Albumin: 4.5 g/dL (ref 3.8–4.9)
Alkaline Phosphatase: 141 IU/L — ABNORMAL HIGH (ref 39–117)
BUN/Creatinine Ratio: 14 (ref 9–23)
BUN: 15 mg/dL (ref 6–24)
Bilirubin Total: 0.6 mg/dL (ref 0.0–1.2)
CO2: 25 mmol/L (ref 20–29)
Calcium: 9.4 mg/dL (ref 8.7–10.2)
Chloride: 100 mmol/L (ref 96–106)
Creatinine, Ser: 1.04 mg/dL — ABNORMAL HIGH (ref 0.57–1.00)
GFR calc Af Amer: 70 mL/min/{1.73_m2} (ref >59)
GFR calc non Af Amer: 61 mL/min/{1.73_m2} (ref >59)
Globulin, Total: 3 g/dL (ref 1.5–4.5)
Glucose: 108 mg/dL — ABNORMAL HIGH (ref 65–99)
Potassium: 4.5 mmol/L (ref 3.5–5.2)
Sodium: 137 mmol/L (ref 134–144)
Total Protein: 7.5 g/dL (ref 6.0–8.5)

## 2019-06-22 LAB — TSH: TSH: 1.43 u[IU]/mL (ref 0.450–4.500)

## 2019-06-22 LAB — CBC WITH DIFFERENTIAL/PLATELET
Basophils Absolute: 0.1 10*3/uL (ref 0.0–0.2)
Basos: 1 %
EOS (ABSOLUTE): 0.1 10*3/uL (ref 0.0–0.4)
Eos: 1 %
Hematocrit: 39.2 % (ref 34.0–46.6)
Hemoglobin: 12.8 g/dL (ref 11.1–15.9)
Immature Grans (Abs): 0 10*3/uL (ref 0.0–0.1)
Immature Granulocytes: 0 %
Lymphocytes Absolute: 2.1 10*3/uL (ref 0.7–3.1)
Lymphs: 33 %
MCH: 30.5 pg (ref 26.6–33.0)
MCHC: 32.7 g/dL (ref 31.5–35.7)
MCV: 94 fL (ref 79–97)
Monocytes Absolute: 0.5 10*3/uL (ref 0.1–0.9)
Monocytes: 8 %
Neutrophils Absolute: 3.5 10*3/uL (ref 1.4–7.0)
Neutrophils: 57 %
Platelets: 298 10*3/uL (ref 150–450)
RBC: 4.19 x10E6/uL (ref 3.77–5.28)
RDW: 14.2 % (ref 11.7–15.4)
WBC: 6.2 10*3/uL (ref 3.4–10.8)

## 2019-06-22 LAB — LIPID PANEL
Chol/HDL Ratio: 4.7 ratio — ABNORMAL HIGH (ref 0.0–4.4)
Cholesterol, Total: 207 mg/dL — ABNORMAL HIGH (ref 100–199)
HDL: 44 mg/dL (ref >39)
LDL Chol Calc (NIH): 136 mg/dL — ABNORMAL HIGH (ref 0–99)
Triglycerides: 153 mg/dL — ABNORMAL HIGH (ref 0–149)
VLDL Cholesterol Cal: 27 mg/dL (ref 5–40)

## 2019-06-25 ENCOUNTER — Other Ambulatory Visit: Payer: Self-pay

## 2019-06-25 ENCOUNTER — Encounter: Payer: Self-pay | Admitting: Family Medicine

## 2019-06-25 ENCOUNTER — Ambulatory Visit (INDEPENDENT_AMBULATORY_CARE_PROVIDER_SITE_OTHER): Payer: Commercial Managed Care - PPO | Admitting: Family Medicine

## 2019-06-25 VITALS — BP 134/85 | HR 64 | Temp 98.3°F | Ht 63.0 in | Wt 230.0 lb

## 2019-06-25 DIAGNOSIS — Z23 Encounter for immunization: Secondary | ICD-10-CM | POA: Diagnosis not present

## 2019-06-25 DIAGNOSIS — Z0001 Encounter for general adult medical examination with abnormal findings: Secondary | ICD-10-CM

## 2019-06-25 DIAGNOSIS — E785 Hyperlipidemia, unspecified: Secondary | ICD-10-CM | POA: Diagnosis not present

## 2019-06-25 DIAGNOSIS — M2550 Pain in unspecified joint: Secondary | ICD-10-CM | POA: Diagnosis not present

## 2019-06-25 DIAGNOSIS — R768 Other specified abnormal immunological findings in serum: Secondary | ICD-10-CM

## 2019-06-25 DIAGNOSIS — Z0000 Encounter for general adult medical examination without abnormal findings: Secondary | ICD-10-CM

## 2019-06-25 NOTE — Progress Notes (Signed)
5/18/202110:49 AM  Natasha Arias Aug 10, 1965, 54 y.o., female [id number]  Chief Complaint  Patient presents with  . Annual Exam    no pap/ done 4 weeks ago  . Fatigue    muscle aches, lower back pain, sob when going upstairs     HPI:   Patient is a 54 y.o. female with past medical history significant for HLP who presents today for CPE  Cervical Cancer Screening: April 2021, Dr Matthew Saras Breast Cancer Screening: April 2021, mammogram Colorectal Cancer Screening: colonoscopy 2015 Bone Density Testing: done by obgyn HIV Screening: declines, not sexually active since last test STI Screening: declines, not sexually active since last test Seasonal Influenza Vaccination: declines Td/Tdap Vaccination: due Pneumococcal Vaccination: at age 22 Zoster Vaccination: at age 34 Frequency of Dental evaluation: scheduled Frequency of Eye evaluation: scheduled, uses contacts   Hearing Screening   125Hz  250Hz  500Hz  1000Hz  2000Hz  3000Hz  4000Hz  6000Hz  8000Hz   Right ear:           Left ear:             Visual Acuity Screening   Right eye Left eye Both eyes  Without correction: 2030 2040 2030  With correction:      In the past had bald spots that were biopsied and told she had borderline lupus Fatigue, myalgias, arthralgias (wrists, knees, ankles) Wrists get swollen, red and warm x 2 months  Reviewed labs done may 2021  Depression screen [facility] 2/9 06/25/2019 01/24/2018 01/12/2018  Decreased Interest 1 0 0  Down, Depressed, Hopeless 0 0 0  PHQ - 2 Score 1 0 0    Fall Risk  06/25/2019 01/24/2018 01/12/2018  Falls in the past year? 0 0 0  Number falls in past yr: 0 - -  Injury with Fall? 0 - -  Follow up Falls evaluation completed - -     No Known Allergies  Prior to Admission medications   Not on File    Past Medical History:  Diagnosis Date  . ADHD (attention deficit hyperactivity disorder), inattentive type   . Anemia   . Depression   . GERD (gastroesophageal reflux  disease)   . IBS (irritable bowel syndrome)     No past surgical history on file.  Social History   Tobacco Use  . Smoking status: Former Smoker    Types: Cigarettes    Quit date: 09/12/2017    Years since quitting: 1.7  . Smokeless tobacco: Never Used  Substance Use Topics  . Alcohol use: Never    Family History  Problem Relation Age of Onset  . Diabetes Mother   . Heart disease Mother   . Hyperlipidemia Mother   . Hypertension Mother   . Stroke Mother   . Diabetes Father   . Heart disease Father   . Hyperlipidemia Father   . Mental illness Father   . Hypertension Brother     Review of Systems  Constitutional: Positive for malaise/fatigue. Negative for chills, fever and weight loss.  Respiratory: Positive for shortness of breath. Negative for cough.   Cardiovascular: Negative for chest pain, palpitations and leg swelling.  Gastrointestinal: Negative for abdominal pain, nausea and vomiting.  Musculoskeletal: Positive for back pain, joint pain and myalgias.  All other systems reviewed and are negative.  Per hpi  OBJECTIVE:  Today's Vitals   06/25/19 1031  BP: 134/85  Pulse: 64  Temp: 98.3 F (36.8 C)  SpO2: 97%  Weight: 230 lb (104.3 kg)  Height: 5\' 3"  (1.6  m)   Body mass index is 40.74 kg/m.  Wt Readings from Last 3 Encounters:  06/25/19 230 lb (104.3 kg)  01/24/18 210 lb 9.6 oz (95.5 kg)  01/12/18 210 lb 3.2 oz (95.3 kg)    Physical Exam Vitals and nursing note reviewed.  Constitutional:      Appearance: She is well-developed.  HENT:     Head: Normocephalic and atraumatic.     Right Ear: Hearing, tympanic membrane, ear canal and external ear normal.     Left Ear: Hearing, tympanic membrane, ear canal and external ear normal.     Mouth/Throat:     Mouth: Mucous membranes are moist.     Pharynx: No oropharyngeal exudate or posterior oropharyngeal erythema.  Eyes:     Extraocular Movements: Extraocular movements intact.     Conjunctiva/sclera:  Conjunctivae normal.     Pupils: Pupils are equal, round, and reactive to light.  Neck:     Thyroid: No thyromegaly.  Cardiovascular:     Rate and Rhythm: Normal rate and regular rhythm.     Heart sounds: Normal heart sounds. No murmur. No friction rub. No gallop.   Pulmonary:     Effort: Pulmonary effort is normal.     Breath sounds: Normal breath sounds. No wheezing, rhonchi or rales.  Abdominal:     General: Bowel sounds are normal. There is no distension.     Palpations: Abdomen is soft. There is no hepatomegaly, splenomegaly or mass.     Tenderness: There is no abdominal tenderness.  Musculoskeletal:        General: Normal range of motion.     Cervical back: Neck supple.     Right lower leg: No edema.     Left lower leg: No edema.  Lymphadenopathy:     Cervical: No cervical adenopathy.  Skin:    General: Skin is warm and dry.  Neurological:     Mental Status: She is alert and oriented to person, place, and time.     Cranial Nerves: No cranial nerve deficit.     Gait: Gait normal.     Deep Tendon Reflexes: Reflexes are normal and symmetric.  Psychiatric:        Mood and Affect: Mood normal.        Behavior: Behavior normal.     No results found for this or any previous visit (from the past 24 hour(s)).  No results found.   ASSESSMENT and PLAN  1. Annual physical exam Routine HCM labs ordered. HCM reviewed/discussed. Anticipatory guidance regarding healthy weight, lifestyle and choices given.   2. Hyperlipidemia, unspecified hyperlipidemia type The 10-year ASCVD risk score Denman George DC Jr., et al., 2013) is: 2.8%  Discussed LFM  3. Multiple joint pain Labs pending, if abnormal will refer to rheum - Sedimentation Rate - C-reactive protein - ANA W/Rfx to all if Positive - Rheumatoid factor  4. Need for vaccination - Td vaccine greater than or equal to 7yo preservative free IM  Return in about 1 year (around 06/24/2020).    [name], MD Primary Care at  [facility] [address] [place], [place] [address] Ph.  [phone] Fax [phone]

## 2019-06-25 NOTE — Patient Instructions (Addendum)
   If you have lab work done today you will be contacted with your lab results within the next 2 weeks.  If you have not heard from us then please contact us. The fastest way to get your results is to register for My Chart.   IF you received an x-ray today, you will receive an invoice from [facility]. Please contact [facility] at [phone] with questions or concerns regarding your invoice.   IF you received labwork today, you will receive an invoice from [facility]. Please contact [facility] at [phone] with questions or concerns regarding your invoice.   Our billing staff will not be able to assist you with questions regarding bills from these companies.  You will be contacted with the lab results as soon as they are available. The fastest way to get your results is to activate your My Chart account. Instructions are located on the last page of this paperwork. If you have not heard from us regarding the results in 2 weeks, please contact this office.     Preventive Care 40-64 Years Old, Female Preventive care refers to visits with your health care provider and lifestyle choices that can promote health and wellness. This includes:  A yearly physical exam. This may also be called an annual well check.  Regular dental visits and eye exams.  Immunizations.  Screening for certain conditions.  Healthy lifestyle choices, such as eating a healthy diet, getting regular exercise, not using drugs or products that contain nicotine and tobacco, and limiting alcohol use. What can I expect for my preventive care visit? Physical exam Your health care provider will check your:  Height and weight. This may be used to calculate body mass index (BMI), which tells if you are at a healthy weight.  Heart rate and blood pressure.  Skin for abnormal spots. Counseling Your health care provider may ask you questions about your:  Alcohol, tobacco, and drug use.  Emotional  well-being.  Home and relationship well-being.  Sexual activity.  Eating habits.  Work and work environment.  Method of birth control.  Menstrual cycle.  Pregnancy history. What immunizations do I need?  Influenza (flu) vaccine  This is recommended every year. Tetanus, diphtheria, and pertussis (Tdap) vaccine  You may need a Td booster every 10 years. Varicella (chickenpox) vaccine  You may need this if you have not been vaccinated. Zoster (shingles) vaccine  You may need this after age 60. Measles, mumps, and rubella (MMR) vaccine  You may need at least one dose of MMR if you were born in 1957 or later. You may also need a second dose. Pneumococcal conjugate (PCV13) vaccine  You may need this if you have certain conditions and were not previously vaccinated. Pneumococcal polysaccharide (PPSV23) vaccine  You may need one or two doses if you smoke cigarettes or if you have certain conditions. Meningococcal conjugate (MenACWY) vaccine  You may need this if you have certain conditions. Hepatitis A vaccine  You may need this if you have certain conditions or if you travel or work in places where you may be exposed to hepatitis A. Hepatitis B vaccine  You may need this if you have certain conditions or if you travel or work in places where you may be exposed to hepatitis B. Haemophilus influenzae type b (Hib) vaccine  You may need this if you have certain conditions. Human papillomavirus (HPV) vaccine  If recommended by your health care provider, you may need three doses over 6 months.   You may receive vaccines as individual doses or as more than one vaccine together in one shot (combination vaccines). Talk with your health care provider about the risks and benefits of combination vaccines. What tests do I need? Blood tests  Lipid and cholesterol levels. These may be checked every 5 years, or more frequently if you are over 50 years old.  Hepatitis C  test.  Hepatitis B test. Screening  Lung cancer screening. You may have this screening every year starting at age 55 if you have a 30-pack-year history of smoking and currently smoke or have quit within the past 15 years.  Colorectal cancer screening. All adults should have this screening starting at age 50 and continuing until age 75. Your health care provider may recommend screening at age 45 if you are at increased risk. You will have tests every 1-10 years, depending on your results and the type of screening test.  Diabetes screening. This is done by checking your blood sugar (glucose) after you have not eaten for a while (fasting). You may have this done every 1-3 years.  Mammogram. This may be done every 1-2 years. Talk with your health care provider about when you should start having regular mammograms. This may depend on whether you have a family history of breast cancer.  BRCA-related cancer screening. This may be done if you have a family history of breast, ovarian, tubal, or peritoneal cancers.  Pelvic exam and Pap test. This may be done every 3 years starting at age 21. Starting at age 30, this may be done every 5 years if you have a Pap test in combination with an HPV test. Other tests  Sexually transmitted disease (STD) testing.  Bone density scan. This is done to screen for osteoporosis. You may have this scan if you are at high risk for osteoporosis. Follow these instructions at home: Eating and drinking  Eat a diet that includes fresh fruits and vegetables, whole grains, lean protein, and low-fat dairy.  Take vitamin and mineral supplements as recommended by your health care provider.  Do not drink alcohol if: ? Your health care provider tells you not to drink. ? You are pregnant, may be pregnant, or are planning to become pregnant.  If you drink alcohol: ? Limit how much you have to 0-1 drink a day. ? Be aware of how much alcohol is in your drink. In the [place] one  drink equals one 12 oz bottle of beer (355 mL), one 5 oz glass of wine (148 mL), or one 1 oz glass of hard liquor (44 mL). Lifestyle  Take daily care of your teeth and gums.  Stay active. Exercise for at least 30 minutes on 5 or more days each week.  Do not use any products that contain nicotine or tobacco, such as cigarettes, e-cigarettes, and chewing tobacco. If you need help quitting, ask your health care provider.  If you are sexually active, practice safe sex. Use a condom or other form of birth control (contraception) in order to prevent pregnancy and STIs (sexually transmitted infections).  If told by your health care provider, take low-dose aspirin daily starting at age 50. What's next?  Visit your health care provider once a year for a well check visit.  Ask your health care provider how often you should have your eyes and teeth checked.  Stay up to date on all vaccines. This information is not intended to replace advice given to you by your health care provider. Make sure   you discuss any questions you have with your health care provider. Document Revised: 10/05/2017 Document Reviewed: 10/05/2017 Elsevier Patient Education  2020 [employer]  

## 2019-06-26 LAB — ANA COMPREHENSIVE PLUS PROFILE
Anti JO-1: <0.2 AI (ref 0.0–0.9)
Antiribosomal P Antibodies: <0.2 AI (ref 0.0–0.9)
Centromere Ab Screen: <0.2 AI (ref 0.0–0.9)
Chromatin Ab SerPl-aCnc: <0.2 AI (ref 0.0–0.9)
ENA RNP Ab: 0.3 AI (ref 0.0–0.9)
ENA SM Ab Ser-aCnc: <0.2 AI (ref 0.0–0.9)
ENA SSA (RO) Ab: >8 AI — ABNORMAL HIGH (ref 0.0–0.9)
ENA SSB (LA) Ab: <0.2 AI (ref 0.0–0.9)
Scleroderma (Scl-70) (ENA) Antibody, IgG: <0.2 AI (ref 0.0–0.9)
Smith/RNP Antibodies: <0.2 AI (ref 0.0–0.9)
dsDNA Ab: 2 IU/mL (ref 0–9)

## 2019-06-26 LAB — SEDIMENTATION RATE: Sed Rate: 17 mm/hr (ref 0–40)

## 2019-06-26 LAB — ANA W/RFX TO ALL IF POSITIVE: Anti Nuclear Antibody (ANA): POSITIVE — AB

## 2019-06-26 LAB — C-REACTIVE PROTEIN: CRP: 2 mg/L (ref 0–10)

## 2019-06-26 LAB — RHEUMATOID FACTOR: Rheumatoid fact SerPl-aCnc: 11.3 IU/mL (ref 0.0–13.9)

## 2019-06-28 ENCOUNTER — Telehealth: Payer: Self-pay | Admitting: Family Medicine

## 2019-06-28 NOTE — Telephone Encounter (Signed)
I'm working on pt's release and I need to know which Dr. Marcelle Overlie she is talking about. [place]? Practice?

## 2019-07-09 NOTE — Addendum Note (Signed)
Addended by: [name] on: 07/09/2019 08:38 AM   Modules accepted: Orders

## 2019-10-31 NOTE — Progress Notes (Signed)
Office Visit Note  Patient: Natasha Arias             Date of Birth: 1965-03-29           MRN: [id number]             PCP: Rutherford Guys, MD Referring: Rutherford Guys, MD Visit Date: 11/14/2019 Occupation: @GUAROCC @  Subjective:  New Patient (Initial Visit) (Abnormal labs)   History of Present Illness: Natasha Arias is a 54 y.o. female seen in consultation per request of her PCP.  According to the patient about 6 years ago she woke up with generalized body pain.  She states she has been experiencing extreme fatigue since then.  She has difficulty even doing routine activities.  She states the pain is mostly in her muscles and not in her joints.  Sometimes she has difficulty when crossing her legs.  She states she is also gained a lot of weight in the last few years.  She denies any history of insomnia.  She had been under a lot of stress as she went through divorce.  She gives history of dry mouth, dry eyes and dry skin.  There is no history of oral ulcers, nasal ulcers, malar rash, photosensitivity, Raynaud's phenomenon or any rashes.  There is no family history of autoimmune disease.  Gravida 4, para 2, miscarriages 2.  There is no history of DVTs.  Complains of lower back pain which is localized without any radiculopathy.  This has been going on for the last few years.  She states increased activity causes increased pain.  Activities of Daily Living:  Patient reports morning stiffness for 0 none.   Patient Denies nocturnal pain.  Difficulty dressing/grooming: Denies Difficulty climbing stairs: Reports Difficulty getting out of chair: Denies Difficulty using hands for taps, buttons, cutlery, and/or writing: Denies  Review of Systems  Constitutional: Positive for fatigue. Negative for night sweats, weight gain and weight loss.  HENT: Positive for mouth dryness. Negative for mouth sores, trouble swallowing, trouble swallowing and nose dryness.   Eyes: Positive for  dryness. Negative for pain, redness and visual disturbance.  Respiratory: Positive for shortness of breath. Negative for cough and difficulty breathing.   Cardiovascular: Positive for swelling in legs/feet. Negative for chest pain, palpitations, hypertension and irregular heartbeat.  Gastrointestinal: Positive for constipation and diarrhea. Negative for blood in stool.  Endocrine: Positive for heat intolerance and excessive thirst. Negative for increased urination.  Genitourinary: Negative for difficulty urinating and vaginal dryness.  Musculoskeletal: Positive for muscle weakness and muscle tenderness. Negative for arthralgias, joint pain, joint swelling, myalgias, morning stiffness and myalgias.  Skin: Negative for color change, rash, hair loss, skin tightness, ulcers and sensitivity to sunlight.  Allergic/Immunologic: Negative for susceptible to infections.  Neurological: Positive for weakness. Negative for dizziness, memory loss and night sweats.  Hematological: Negative for bruising/bleeding tendency and swollen glands.  Psychiatric/Behavioral: Negative for depressed mood and sleep disturbance. The patient is not nervous/anxious.     PMFS History:  Patient Active Problem List   Diagnosis Date Noted  . RECTAL PAIN 01/21/2010  . GERD 10/09/2008  . RECTAL BLEEDING 10/09/2008  . ABDOMINAL BLOATING 10/09/2008  . RUQ PAIN 10/09/2008  . HEMORRHOIDS 10/03/2008    Past Medical History:  Diagnosis Date  . ADHD (attention deficit hyperactivity disorder), inattentive type   . Anemia   . Depression   . GERD (gastroesophageal reflux disease)   . IBS (irritable bowel syndrome)     Family  History  Problem Relation Age of Onset  . Diabetes Mother   . Heart disease Mother   . Hyperlipidemia Mother   . Hypertension Mother   . Stroke Mother   . Diabetes Father   . Heart disease Father   . Hyperlipidemia Father   . Mental illness Father   . Hypertension Brother    History reviewed. No  pertinent surgical history. Social History   Social History Narrative   ** Merged History Encounter **       Immunization History  Administered Date(s) Administered  . Janssen (J&J) SARS-COV-2 Vaccination 04/21/2019  . Td 06/25/2019     Objective: Vital Signs: BP 136/84 (BP Location: Right Arm, Patient Position: Sitting, Cuff Size: Normal)   Pulse 66   Resp 16   Ht 5' 3"  (1.6 m)   Wt 238 lb (108 kg)   BMI 42.16 kg/m    Physical Exam Vitals and nursing note reviewed.  Constitutional:      Appearance: She is well-developed.  HENT:     Head: Normocephalic and atraumatic.  Eyes:     Conjunctiva/sclera: Conjunctivae normal.  Cardiovascular:     Rate and Rhythm: Normal rate and regular rhythm.     Heart sounds: Normal heart sounds.  Pulmonary:     Effort: Pulmonary effort is normal.     Breath sounds: Normal breath sounds.  Abdominal:     General: Bowel sounds are normal.     Palpations: Abdomen is soft.  Musculoskeletal:     Cervical back: Normal range of motion.  Lymphadenopathy:     Cervical: No cervical adenopathy.  Skin:    General: Skin is warm and dry.     Capillary Refill: Capillary refill takes less than 2 seconds.  Neurological:     Mental Status: She is alert and oriented to person, place, and time.  Psychiatric:        Behavior: Behavior normal.      Musculoskeletal Exam: C-spine thoracic and lumbar spine with good range of motion.  She has some tenderness over SI joints and gluteal region.  She also had tenderness over trochanteric bursa.  Shoulder joints, elbow joints, wrist joints, MCPs and PIPs with good range of motion with no synovitis.  Hip joints, knee joints, ankles, MTPs and PIPs with good range of motion with no synovitis.  She has generalized hyperalgesia and multiple point tender points.  CDAI Exam: CDAI Score: -- Patient Global: --; Provider Global: -- Swollen: --; Tender: -- Joint Exam 11/14/2019   No joint exam has been documented for  this visit   There is currently no information documented on the homunculus. Go to the Rheumatology activity and complete the homunculus joint exam.  Investigation: No additional findings.  Imaging: No results found.  Recent Labs: Lab Results  Component Value Date   WBC 6.2 06/21/2019   HGB 12.8 06/21/2019   PLT 298 06/21/2019   NA 137 06/21/2019   K 4.5 06/21/2019   CL 100 06/21/2019   CO2 25 06/21/2019   GLUCOSE 108 (H) 06/21/2019   BUN 15 06/21/2019   CREATININE 1.04 (H) 06/21/2019   BILITOT 0.6 06/21/2019   ALKPHOS 141 (H) 06/21/2019   AST 31 06/21/2019   ALT 41 (H) 06/21/2019   PROT 7.5 06/21/2019   ALBUMIN 4.5 06/21/2019   CALCIUM 9.4 06/21/2019   GFRAA 70 06/21/2019  Jun 21, 2019 TSH normal, Jun 25, 2019 ESR 17, CRP 2, RF negative  06/25/19: ANA+, CRP 2, ESR 17, dsDNA  2, RNP 0.3, Sm-, RNP-, Scl-70-, Ro>8, La-, chromatin ab-, Anti-Jo1-, centromere ab negative   Speciality Comments: No specialty comments available.  Procedures:  No procedures performed Allergies: Patient has no known allergies.   Assessment / Plan:     Visit Diagnoses: Sjogren's syndrome with keratoconjunctivitis sicca ([place]) - 06/25/19: ANA+, CRP 2, ESR 17, dsDNA 2, RNP 0.3, Sm-, RNP-, Scl-70-, Ro>8, La-, chromatin ab-, Anti-Jo1-, centromere ab negative -she has positive ANA and positive Ro antibody.  She complains of dry mouth and dry eyes and dry skin.  Detailed counseling guarding Sjogren's was provided.  Over-the-counter products were discussed.  I will obtain additional labs today.  ANA titer was not available.  She also has history of miscarriages in the past.  I discussed that positive Ro antibody could be associated with arrhythmias.  I would recommend her to get an EKG with her primary care physician.  A handout on Sjogren's was given.  Plan: ANA, C3 and C4, Beta-2 glycoprotein antibodies, Cardiolipin antibodies, IgG, IgM, IgA, Lupus Anticoagulant Eval w/Reflex  Shortness of breath -complaints  of shortness of breath on exertion.  She has Sjogren's I will obtain a baseline chest x-ray.  Plan: DG Chest 2 View  Chronic SI joint pain -she had tenderness over SI joints and also at the gluteal region.  I believe is due to underlying fibromyalgia.  Plan: XR Pelvis 1-2 Views.  X-ray of the pelvis was unremarkable.  Have given her a handout on back exercises.  Myalgia -she complains of generalized muscle pain for the last 6 years.  She has been under a lot of stress in the last few years due to a divorce.  She denies any insomnia.  She has multiple tender points and hyperalgesia.  I believe she has fibromyalgia syndrome.  I will obtain CK today.  Detailed counseling was provided.  Need for aerobics weight loss and stretching exercises were discussed.  Plan: CK  Other fatigue - Plan: COMPLETE METABOLIC PANEL WITH GFR, CK, Serum protein electrophoresis with reflex, Glucose 6 phosphate dehydrogenase  History of gastroesophageal reflux (GERD)  History of hemorrhoids  BMI 40.0-44.9, adult (HCC)-she has gained out of weight in the last few years.  Have given her a handout on weight loss clinic.  Educated about COVID-19 virus infection-she has been advised to get a booster once available to her.  Use of mask, social distancing and hand hygiene was discussed.  I also discussed the use of monoclonal antibodies infusion in case she develops COVID-19 infection.  Orders: Orders Placed This Encounter  Procedures  . XR Pelvis 1-2 Views  . DG Chest 2 View  . COMPLETE METABOLIC PANEL WITH GFR  . CK  . Serum protein electrophoresis with reflex  . Glucose 6 phosphate dehydrogenase  . ANA  . C3 and C4  . Beta-2 glycoprotein antibodies  . Cardiolipin antibodies, IgG, IgM, IgA  . Lupus Anticoagulant Eval w/Reflex   No orders of the defined types were placed in this encounter.     Follow-Up Instructions: Return for Sjogren's, myalgia.   Bo Merino, MD  Note - This record has been created  using Editor, commissioning.  Chart creation errors have been sought, but may not always  have been located. Such creation errors do not reflect on  the standard of medical care.

## 2019-11-14 ENCOUNTER — Other Ambulatory Visit: Payer: Self-pay

## 2019-11-14 ENCOUNTER — Encounter: Payer: Self-pay | Admitting: Rheumatology

## 2019-11-14 ENCOUNTER — Ambulatory Visit: Payer: Self-pay

## 2019-11-14 ENCOUNTER — Ambulatory Visit: Payer: BC Managed Care – PPO | Admitting: Rheumatology

## 2019-11-14 VITALS — BP 136/84 | HR 66 | Resp 16 | Ht 63.0 in | Wt 238.0 lb

## 2019-11-14 DIAGNOSIS — M791 Myalgia, unspecified site: Secondary | ICD-10-CM

## 2019-11-14 DIAGNOSIS — G8929 Other chronic pain: Secondary | ICD-10-CM | POA: Diagnosis not present

## 2019-11-14 DIAGNOSIS — Z6841 Body Mass Index (BMI) 40.0 and over, adult: Secondary | ICD-10-CM

## 2019-11-14 DIAGNOSIS — M533 Sacrococcygeal disorders, not elsewhere classified: Secondary | ICD-10-CM | POA: Diagnosis not present

## 2019-11-14 DIAGNOSIS — M3501 Sicca syndrome with keratoconjunctivitis: Secondary | ICD-10-CM

## 2019-11-14 DIAGNOSIS — Z7189 Other specified counseling: Secondary | ICD-10-CM

## 2019-11-14 DIAGNOSIS — R5383 Other fatigue: Secondary | ICD-10-CM

## 2019-11-14 DIAGNOSIS — R0602 Shortness of breath: Secondary | ICD-10-CM | POA: Diagnosis not present

## 2019-11-14 DIAGNOSIS — Z8719 Personal history of other diseases of the digestive system: Secondary | ICD-10-CM

## 2019-11-14 NOTE — Patient Instructions (Signed)
Please get an EKG with your PCP.  Positive SSA antibody can be associated with arrhythmias.   Sjgren's Syndrome Sjgren's syndrome is a disease in which the body's disease-fighting system (immune system) attacks the glands that produce tears (lacrimal glands) and the glands that produce saliva (salivary glands). This makes the eyes and mouth very dry. Sjgren's syndrome is a long-term (chronic) disorder that has no cure. In some cases, it is linked to other disorders (rheumatic disorders), such as rheumatoid arthritis and systemic lupus erythematosus (SLE). It may affect other parts of the body, such as the:  Kidneys.  Blood vessels.  Joints.  Lungs.  Liver.  Pancreas.  Brain.  Nerves.  Spinal cord. What are the causes? The cause of this condition is not known. It may be passed along from parent to child (inherited), or it may be a symptom of a rheumatic disorder. What increases the risk? This condition is more likely to develop in:  Women.  People who are 6845-56 years old.  People who have recently had a viral infection or currently have a viral infection. What are the signs or symptoms? The main symptoms of this condition are:  Dry mouth. This may include: ? A chalky feeling. ? Difficulty swallowing, speaking, or tasting. ? Frequent cavities in the teeth. ? Frequent mouth infections.  Dry eyes. This may include: ? Burning, redness, and itching. ? Blurry vision. ? Light sensitivity. Other symptoms may include:  Dryness of the skin and the inside of the nose.  Eyelid infections.  Vaginal dryness, if this applies.  Joint pain and stiffness.  Muscle pain and stiffness. How is this diagnosed? This condition is diagnosed based on:  Your symptoms.  Your medical history.  A physical exam of your eyes and mouth.  Tests, including: ? A Schirmer test. This tests your tear production. ? An eye exam that is done with a magnifying device (slit-lamp  exam). ? An eye test that temporarily stains your eye with dye. This shows the extent of eye damage. ? Tests to check your salivary gland function. ? Biopsy. This is a removal of part of a salivary gland from inside your lower lip to be studied under a microscope. ? Chest X-rays. ? Blood tests. ? Urine tests. How is this treated? There is no cure for this condition, but treatment can help you manage your symptoms. This condition may be treated with:  Moisture replacement therapies to help relieve dryness in your skin, mouth, and eyes.  Medicines to help relieve pain and stiffness.  Medicines to help relieve inflammation in your body (corticosteroids). These are usually for severe cases.  Medicines to help reduce the activity of your immune system (immunosuppressants).  Surgery or insertion of plugs to close the lacrimal glands (punctal occlusion). This helps keep more natural tears in your eyes. Follow these instructions at home: Eye care   Use eye drops as told by your health care provider.  Protect your eyes from the sun and wind with sunglasses or glasses.  Blink at least 5-6 times a minute.  Maintain properly humidified air. You may want to use a humidifier at home.  Avoid smoke. Mouth care  Brush your teeth and floss after every meal.  Chew sugar-free gum or suck on hard candy. This may help to relieve dry mouth.  Use antimicrobial mouthwash daily.  Take frequent sips of water or sugar-free drinks.  Use saliva substitutes or lip balm as told by your health care provider.  Schedule and attend  dentist visits every 6 months. General instructions   Take over-the-counter and prescription medicines only as told by your health care provider.  Drink enough fluid to keep your urine pale yellow.  Keep all follow-up visits as told by your health care provider. This is important. Contact a health care provider if:  You have a fever.  You have night sweats.  You are  always tired.  You have unexplained weight loss.  You develop itchy skin.  You have red patches on your skin.  You have a lump or swelling on your neck. Summary  Sjgren's syndrome is a disease in which the body's disease-fighting system attacks the glands that produce tears and the glands that produce saliva.  This condition makes the eyes and mouth very dry.  Sjgren's syndrome is a long-term (chronic) disorder that has no cure.  The cause of this condition is not known.  There is no cure for this condition, but treatment can help you manage your symptoms. This information is not intended to replace advice given to you by your health care provider. Make sure you discuss any questions you have with your health care provider. Document Revised: 11/30/2017 Document Reviewed: 11/30/2017 Elsevier Patient Education  2020 [employer] Back Exercises The following exercises strengthen the muscles that help to support the trunk and back. They also help to keep the lower back flexible. Doing these exercises can help to prevent back pain or lessen existing pain.  If you have back pain or discomfort, try doing these exercises 2-3 times each day or as told by your health care provider.  As your pain improves, do them once each day, but increase the number of times that you repeat the steps for each exercise (do more repetitions).  To prevent the recurrence of back pain, continue to do these exercises once each day or as told by your health care provider. Do exercises exactly as told by your health care provider and adjust them as directed. It is normal to feel mild stretching, pulling, tightness, or discomfort as you do these exercises, but you should stop right away if you feel sudden pain or your pain gets worse. Exercises Single knee to chest Repeat these steps 3-5 times for each leg: 1. Lie on your back on a firm bed or the floor with your legs extended. 2. Bring one knee to your chest.  Your other leg should stay extended and in contact with the floor. 3. Hold your knee in place by grabbing your knee or thigh with both hands and hold. 4. Pull on your knee until you feel a gentle stretch in your lower back or buttocks. 5. Hold the stretch for 10-30 seconds. 6. Slowly release and straighten your leg. Pelvic tilt Repeat these steps 5-10 times: 1. Lie on your back on a firm bed or the floor with your legs extended. 2. Bend your knees so they are pointing toward the ceiling and your feet are flat on the floor. 3. Tighten your lower abdominal muscles to press your lower back against the floor. This motion will tilt your pelvis so your tailbone points up toward the ceiling instead of pointing to your feet or the floor. 4. With gentle tension and even breathing, hold this position for 5-10 seconds. Cat-cow Repeat these steps until your lower back becomes more flexible: 1. Get into a hands-and-knees position on a firm surface. Keep your hands under your shoulders, and keep your knees under your hips. You may place padding under  your knees for comfort. 2. Let your head hang down toward your chest. Contract your abdominal muscles and point your tailbone toward the floor so your lower back becomes rounded like the back of a cat. 3. Hold this position for 5 seconds. 4. Slowly lift your head, let your abdominal muscles relax and point your tailbone up toward the ceiling so your back forms a sagging arch like the back of a cow. 5. Hold this position for 5 seconds.  Press-ups Repeat these steps 5-10 times: 1. Lie on your abdomen (face-down) on the floor. 2. Place your palms near your head, about shoulder-width apart. 3. Keeping your back as relaxed as possible and keeping your hips on the floor, slowly straighten your arms to raise the top half of your body and lift your shoulders. Do not use your back muscles to raise your upper torso. You may adjust the placement of your hands to make  yourself more comfortable. 4. Hold this position for 5 seconds while you keep your back relaxed. 5. Slowly return to lying flat on the floor.  Bridges Repeat these steps 10 times: 1. Lie on your back on a firm surface. 2. Bend your knees so they are pointing toward the ceiling and your feet are flat on the floor. Your arms should be flat at your sides, next to your body. 3. Tighten your buttocks muscles and lift your buttocks off the floor until your waist is at almost the same height as your knees. You should feel the muscles working in your buttocks and the back of your thighs. If you do not feel these muscles, slide your feet 1-2 inches farther away from your buttocks. 4. Hold this position for 3-5 seconds. 5. Slowly lower your hips to the starting position, and allow your buttocks muscles to relax completely. If this exercise is too easy, try doing it with your arms crossed over your chest. Abdominal crunches Repeat these steps 5-10 times: 1. Lie on your back on a firm bed or the floor with your legs extended. 2. Bend your knees so they are pointing toward the ceiling and your feet are flat on the floor. 3. Cross your arms over your chest. 4. Tip your chin slightly toward your chest without bending your neck. 5. Tighten your abdominal muscles and slowly raise your trunk (torso) high enough to lift your shoulder blades a tiny bit off the floor. Avoid raising your torso higher than that because it can put too much stress on your low back and does not help to strengthen your abdominal muscles. 6. Slowly return to your starting position. Back lifts Repeat these steps 5-10 times: 1. Lie on your abdomen (face-down) with your arms at your sides, and rest your forehead on the floor. 2. Tighten the muscles in your legs and your buttocks. 3. Slowly lift your chest off the floor while you keep your hips pressed to the floor. Keep the back of your head in line with the curve in your back. Your eyes  should be looking at the floor. 4. Hold this position for 3-5 seconds. 5. Slowly return to your starting position. Contact a health care provider if:  Your back pain or discomfort gets much worse when you do an exercise.  Your worsening back pain or discomfort does not lessen within 2 hours after you exercise. If you have any of these problems, stop doing these exercises right away. Do not do them again unless your health care provider says that you can.  Get help right away if:  You develop sudden, severe back pain. If this happens, stop doing the exercises right away. Do not do them again unless your health care provider says that you can. This information is not intended to replace advice given to you by your health care provider. Make sure you discuss any questions you have with your health care provider. Document Revised: 05/31/2018 Document Reviewed: 10/26/2017 Elsevier Patient Education  2020 [employer].

## 2019-11-19 ENCOUNTER — Ambulatory Visit (INDEPENDENT_AMBULATORY_CARE_PROVIDER_SITE_OTHER): Payer: BC Managed Care – PPO

## 2019-11-19 ENCOUNTER — Other Ambulatory Visit: Payer: Self-pay

## 2019-11-19 DIAGNOSIS — R5383 Other fatigue: Secondary | ICD-10-CM | POA: Diagnosis not present

## 2019-11-19 DIAGNOSIS — R0602 Shortness of breath: Secondary | ICD-10-CM

## 2019-11-20 LAB — COMPLETE METABOLIC PANEL WITH GFR
AG Ratio: 1.6 (calc) (ref 1.0–2.5)
ALT: 23 U/L (ref 6–29)
AST: 19 U/L (ref 10–35)
Albumin: 4.3 g/dL (ref 3.6–5.1)
Alkaline phosphatase (APISO): 121 U/L (ref 37–153)
BUN: 13 mg/dL (ref 7–25)
CO2: 26 mmol/L (ref 20–32)
Calcium: 9.3 mg/dL (ref 8.6–10.4)
Chloride: 105 mmol/L (ref 98–110)
Creat: 0.92 mg/dL (ref 0.50–1.05)
GFR, Est African American: 81 mL/min/{1.73_m2} (ref >=60)
GFR, Est Non African American: 70 mL/min/{1.73_m2} (ref >=60)
Globulin: 2.7 g/dL (calc) (ref 1.9–3.7)
Glucose, Bld: 94 mg/dL (ref 65–99)
Potassium: 4.2 mmol/L (ref 3.5–5.3)
Sodium: 140 mmol/L (ref 135–146)
Total Bilirubin: 0.6 mg/dL (ref 0.2–1.2)
Total Protein: 7 g/dL (ref 6.1–8.1)

## 2019-11-20 LAB — CARDIOLIPIN ANTIBODIES, IGG, IGM, IGA
Anticardiolipin IgA: 3.8 APL-U/mL
Anticardiolipin IgG: <2 GPL-U/mL
Anticardiolipin IgM: 16 MPL-U/mL

## 2019-11-20 LAB — LUPUS ANTICOAGULANT EVAL W/ REFLEX
PTT-LA Screen: 31 s (ref <=40)
dRVVT: 37 s (ref <=45)

## 2019-11-20 LAB — ANTI-NUCLEAR AB-TITER (ANA TITER)
ANA TITER: 1:40 {titer} — ABNORMAL HIGH
ANA Titer 1: 1:80 {titer} — ABNORMAL HIGH

## 2019-11-20 LAB — ANA: Anti Nuclear Antibody (ANA): POSITIVE — AB

## 2019-11-20 LAB — GLUCOSE 6 PHOSPHATE DEHYDROGENASE: G-6PDH: 21.8 U/g Hgb — ABNORMAL HIGH (ref 7.0–20.5)

## 2019-11-20 LAB — CK: Total CK: 129 U/L (ref 29–143)

## 2019-11-20 LAB — PROTEIN ELECTROPHORESIS, SERUM, WITH REFLEX
Albumin ELP: 4.1 g/dL (ref 3.8–4.8)
Alpha 1: 0.3 g/dL (ref 0.2–0.3)
Alpha 2: 0.7 g/dL (ref 0.5–0.9)
Beta 2: 0.4 g/dL (ref 0.2–0.5)
Beta Globulin: 0.5 g/dL (ref 0.4–0.6)
Gamma Globulin: 1.1 g/dL (ref 0.8–1.7)
Total Protein: 7.2 g/dL (ref 6.1–8.1)

## 2019-11-20 LAB — C3 AND C4
C3 Complement: 166 mg/dL (ref 83–193)
C4 Complement: 35 mg/dL (ref 15–57)

## 2019-11-20 LAB — BETA-2 GLYCOPROTEIN ANTIBODIES
Beta-2 Glyco 1 IgA: <2 U/mL
Beta-2 Glyco 1 IgM: 13.7 U/mL
Beta-2 Glyco I IgG: <2 U/mL

## 2019-11-20 NOTE — Progress Notes (Signed)
CXR is normal

## 2019-11-20 NOTE — Progress Notes (Signed)
I will discuss results at the follow-up visit.

## 2019-11-30 NOTE — Progress Notes (Signed)
Office Visit Note  Patient: Natasha Arias             Date of Birth: May 04, 1965           MRN: [id number]             PCP: [name], MD (Inactive) Referring: [name], MD Visit Date: 12/09/2019 Occupation: @GUAROCC @  Subjective:  Dry mouth, dry eyes and fatigue.   History of Present Illness: Natasha Arias is a 54 y.o. female with history of Sjogren's, and fibromyalgia syndrome.  She continues to have dry mouth and dry eye symptoms.  She states she has extreme fatigue.  She continues to have shortness of breath.  She also has lower back pain which persists.  Fibromyalgia is active with generalized pain all over.  She states that she had bilateral breast implants about 10 years ago.  And she is concerned that her symptoms are related to the breast implants.  She has done a lot of reading on it.  Activities of Daily Living:  Patient reports morning stiffness for 5-10 minutes.   Patient Denies nocturnal pain.  Difficulty dressing/grooming: Denies Difficulty climbing stairs: Denies Difficulty getting out of chair: Denies Difficulty using hands for taps, buttons, cutlery, and/or writing: Denies  Review of Systems  Constitutional: Positive for fatigue.  HENT: Positive for mouth dryness and nose dryness. Negative for mouth sores.   Eyes: Positive for dryness. Negative for pain and itching.  Respiratory: Positive for shortness of breath. Negative for difficulty breathing.   Cardiovascular: Negative for chest pain and palpitations.  Gastrointestinal: Positive for constipation. Negative for blood in stool and diarrhea.  Endocrine: Negative for increased urination.  Genitourinary: Negative for difficulty urinating.  Musculoskeletal: Positive for arthralgias, joint pain, myalgias, morning stiffness, muscle tenderness and myalgias. Negative for joint swelling.  Skin: Negative for color change, rash and redness.  Allergic/Immunologic: Negative for susceptible to  infections.  Neurological: Positive for parasthesias, memory loss and weakness. Negative for dizziness, numbness and headaches.  Hematological: Negative for bruising/bleeding tendency.  Psychiatric/Behavioral: Positive for confusion.    PMFS History:  Patient Active Problem List   Diagnosis Date Noted  . Sjogren's syndrome with keratoconjunctivitis sicca (HCC) 12/09/2019  . Fibromyalgia 12/09/2019  . RECTAL PAIN 01/21/2010  . GERD 10/09/2008  . RECTAL BLEEDING 10/09/2008  . ABDOMINAL BLOATING 10/09/2008  . RUQ PAIN 10/09/2008  . HEMORRHOIDS 10/03/2008    Past Medical History:  Diagnosis Date  . ADHD (attention deficit hyperactivity disorder), inattentive type   . Anemia   . Depression   . GERD (gastroesophageal reflux disease)   . IBS (irritable bowel syndrome)     Family History  Problem Relation Age of Onset  . Diabetes Mother   . Heart disease Mother   . Hyperlipidemia Mother   . Hypertension Mother   . Stroke Mother   . Diabetes Father   . Heart disease Father   . Hyperlipidemia Father   . Mental illness Father   . Hypertension Brother    History reviewed. No pertinent surgical history. Social History   Social History Narrative   ** Merged History Encounter **       Immunization History  Administered Date(s) Administered  . Janssen (J&J) SARS-COV-2 Vaccination 04/21/2019  . Td 06/25/2019     Objective: Vital Signs: BP 132/83 (BP Location: Left Arm, Patient Position: Sitting, Cuff Size: Normal)   Pulse 64   Resp 15   Ht 5\' 3"  (1.6 m)   Wt  242 lb 9.6 oz (110 kg)   BMI 42.97 kg/m    Physical Exam Vitals and nursing note reviewed.  Constitutional:      Appearance: She is well-developed.  HENT:     Head: Normocephalic and atraumatic.  Eyes:     Conjunctiva/sclera: Conjunctivae normal.  Cardiovascular:     Rate and Rhythm: Normal rate and regular rhythm.     Heart sounds: Normal heart sounds.  Pulmonary:     Effort: Pulmonary effort is normal.      Breath sounds: Normal breath sounds.  Abdominal:     General: Bowel sounds are normal.     Palpations: Abdomen is soft.  Musculoskeletal:     Cervical back: Normal range of motion.  Lymphadenopathy:     Cervical: No cervical adenopathy.  Skin:    General: Skin is warm and dry.     Capillary Refill: Capillary refill takes less than 2 seconds.  Neurological:     Mental Status: She is alert and oriented to person, place, and time.  Psychiatric:        Behavior: Behavior normal.      Musculoskeletal Exam: C-spine thoracic and lumbar spine with good range of motion.  Shoulder joints, elbow joints, wrist joints, MCPs PIPs and DIPs with good range of motion.  Hip joints, knee joints, ankles, MTPs and PIPs with good range of motion.  No synovitis was noted on the examination.  She has generalized hyperalgesia and positive tender points.  CDAI Exam: CDAI Score: -- Patient Global: --; Provider Global: -- Swollen: --; Tender: -- Joint Exam 12/09/2019   No joint exam has been documented for this visit   There is currently no information documented on the homunculus. Go to the Rheumatology activity and complete the homunculus joint exam.  Investigation: No additional findings.  Imaging: DG Chest 2 View  Result Date: 11/20/2019 CLINICAL DATA:  Shortness of breath, fatigue EXAM: CHEST - 2 VIEW COMPARISON:  01/12/2018 FINDINGS: The heart size and mediastinal contours are within normal limits. Both lungs are clear. The visualized skeletal structures are unremarkable. IMPRESSION: No active cardiopulmonary disease. Electronically Signed   By: Elige Ko   On: 11/20/2019 06:07   XR Pelvis 1-2 Views  Result Date: 11/14/2019 No SI joint sclerosis or narrowing was noted.  No hip joint narrowing was noted. Impression: Unremarkable x-ray of the pelvis.   Recent Labs: Lab Results  Component Value Date   WBC 6.2 06/21/2019   HGB 12.8 06/21/2019   PLT 298 06/21/2019   NA 140 11/14/2019   K  4.2 11/14/2019   CL 105 11/14/2019   CO2 26 11/14/2019   GLUCOSE 94 11/14/2019   BUN 13 11/14/2019   CREATININE 0.92 11/14/2019   BILITOT 0.6 11/14/2019   ALKPHOS 141 (H) 06/21/2019   AST 19 11/14/2019   ALT 23 11/14/2019   PROT 7.0 11/14/2019   PROT 7.2 11/14/2019   ALBUMIN 4.5 06/21/2019   CALCIUM 9.3 11/14/2019   GFRAA 81 11/14/2019   November 14, 2019 SPEP normal, G6PD 21.8(H), ANA 1: 80 speckled, C3-C4 normal, anticardiolipin negative, beta-2 negative, lupus anticoagulant negative, CK normal  Speciality Comments: No specialty comments available.  Procedures:  No procedures performed Allergies: Patient has no known allergies.   Assessment / Plan:     Visit Diagnoses: Sjogren's syndrome with keratoconjunctivitis sicca (HCC) - ANA 1: 80 speckled, Ro> 8, dry mouth, dry eyes and dry skin.  EKG was advised.  Detailed counseling regarding Sjogren's was provided.  Patient  states she had EKG this year which was normal.  I had detailed discussion regarding the use of hydroxychloroquine.  Patient states she would like to think about it.  I also discussed possible use of pilocarpine after the pulmonary evaluation.  Shortness of breath - November 14, 2019 chest x-ray unremarkable.  I discussed high-resolution CT or pulmonary referral.  She would prefer to see a pulmonologist before getting a CT scan.  It will be important to rule out asthma before putting her on pilocarpine as well.  I will refer her to pulmonologist.  Chronic SI joint pain - X-rays of the SI joints were normal.  Other fatigue-we had detailed discussion regarding fatigue which could be related to Sjogren's or fibromyalgia.  Fibromyalgia - Generalized pain, hyperalgesia and positive tender points.  Weight loss and stretching exercises were discussed at the last visit.  Need for regular exercise was emphasized.  History of gastroesophageal reflux (GERD)  History of hemorrhoids  BMI 40.0-44.9, adult (HCC) - Advised  appointment at the weight loss clinic.  History of bilateral breast implants-patient had bilateral breast implants about 10 years ago.  She states she has done a lot of reading and believes that her symptoms are related to breast implants.  She is planning to have breast implants removed.  Orders: Orders Placed This Encounter  Procedures  . Ambulatory referral to Pulmonology   No orders of the defined types were placed in this encounter.     Follow-Up Instructions: Return in about 2 months (around 02/08/2020) for Sjogren's.   Pollyann Savoy, MD  Note - This record has been created using Animal nutritionist.  Chart creation errors have been sought, but may not always  have been located. Such creation errors do not reflect on  the standard of medical care.

## 2019-12-09 ENCOUNTER — Ambulatory Visit: Payer: BC Managed Care – PPO | Admitting: Rheumatology

## 2019-12-09 ENCOUNTER — Other Ambulatory Visit: Payer: Self-pay

## 2019-12-09 ENCOUNTER — Encounter: Payer: Self-pay | Admitting: Rheumatology

## 2019-12-09 VITALS — BP 132/83 | HR 64 | Resp 15 | Ht 63.0 in | Wt 242.6 lb

## 2019-12-09 DIAGNOSIS — R0602 Shortness of breath: Secondary | ICD-10-CM | POA: Diagnosis not present

## 2019-12-09 DIAGNOSIS — M797 Fibromyalgia: Secondary | ICD-10-CM

## 2019-12-09 DIAGNOSIS — Z6841 Body Mass Index (BMI) 40.0 and over, adult: Secondary | ICD-10-CM

## 2019-12-09 DIAGNOSIS — G8929 Other chronic pain: Secondary | ICD-10-CM

## 2019-12-09 DIAGNOSIS — M533 Sacrococcygeal disorders, not elsewhere classified: Secondary | ICD-10-CM | POA: Diagnosis not present

## 2019-12-09 DIAGNOSIS — Z9882 Breast implant status: Secondary | ICD-10-CM

## 2019-12-09 DIAGNOSIS — M3501 Sicca syndrome with keratoconjunctivitis: Secondary | ICD-10-CM | POA: Diagnosis not present

## 2019-12-09 DIAGNOSIS — R5383 Other fatigue: Secondary | ICD-10-CM

## 2019-12-09 DIAGNOSIS — Z8719 Personal history of other diseases of the digestive system: Secondary | ICD-10-CM

## 2019-12-09 NOTE — Patient Instructions (Addendum)
Sjgren's Syndrome Sjgren's syndrome is a disease in which the body's disease-fighting system (immune system) attacks the glands that produce tears (lacrimal glands) and the glands that produce saliva (salivary glands). This makes the eyes and mouth very dry. Sjgren's syndrome is a long-term (chronic) disorder that has no cure. In some cases, it is linked to other disorders (rheumatic disorders), such as rheumatoid arthritis and systemic lupus erythematosus (SLE). It may affect other parts of the body, such as the:  Kidneys.  Blood vessels.  Joints.  Lungs.  Liver.  Pancreas.  Brain.  Nerves.  Spinal cord. What are the causes? The cause of this condition is not known. It may be passed along from parent to child (inherited), or it may be a symptom of a rheumatic disorder. What increases the risk? This condition is more likely to develop in:  Women.  People who are 45-50 years old.  People who have recently had a viral infection or currently have a viral infection. What are the signs or symptoms? The main symptoms of this condition are:  Dry mouth. This may include: ? A chalky feeling. ? Difficulty swallowing, speaking, or tasting. ? Frequent cavities in the teeth. ? Frequent mouth infections.  Dry eyes. This may include: ? Burning, redness, and itching. ? Blurry vision. ? Light sensitivity. Other symptoms may include:  Dryness of the skin and the inside of the nose.  Eyelid infections.  Vaginal dryness, if this applies.  Joint pain and stiffness.  Muscle pain and stiffness. How is this diagnosed? This condition is diagnosed based on:  Your symptoms.  Your medical history.  A physical exam of your eyes and mouth.  Tests, including: ? A Schirmer test. This tests your tear production. ? An eye exam that is done with a magnifying device (slit-lamp exam). ? An eye test that temporarily stains your eye with dye. This shows the extent of eye  damage. ? Tests to check your salivary gland function. ? Biopsy. This is a removal of part of a salivary gland from inside your lower lip to be studied under a microscope. ? Chest X-rays. ? Blood tests. ? Urine tests. How is this treated? There is no cure for this condition, but treatment can help you manage your symptoms. This condition may be treated with:  Moisture replacement therapies to help relieve dryness in your skin, mouth, and eyes.  Medicines to help relieve pain and stiffness.  Medicines to help relieve inflammation in your body (corticosteroids). These are usually for severe cases.  Medicines to help reduce the activity of your immune system (immunosuppressants).  Surgery or insertion of plugs to close the lacrimal glands (punctal occlusion). This helps keep more natural tears in your eyes. Follow these instructions at home: Eye care   Use eye drops as told by your health care provider.  Protect your eyes from the sun and wind with sunglasses or glasses.  Blink at least 5-6 times a minute.  Maintain properly humidified air. You may want to use a humidifier at home.  Avoid smoke. Mouth care  Brush your teeth and floss after every meal.  Chew sugar-free gum or suck on hard candy. This may help to relieve dry mouth.  Use antimicrobial mouthwash daily.  Take frequent sips of water or sugar-free drinks.  Use saliva substitutes or lip balm as told by your health care provider.  Schedule and attend dentist visits every 6 months. General instructions   Take over-the-counter and prescription medicines only as told by   your health care provider.  Drink enough fluid to keep your urine pale yellow.  Keep all follow-up visits as told by your health care provider. This is important. Contact a health care provider if:  You have a fever.  You have night sweats.  You are always tired.  You have unexplained weight loss.  You develop itchy skin.  You have red  patches on your skin.  You have a lump or swelling on your neck. Summary  Sjgren's syndrome is a disease in which the body's disease-fighting system attacks the glands that produce tears and the glands that produce saliva.  This condition makes the eyes and mouth very dry.  Sjgren's syndrome is a long-term (chronic) disorder that has no cure.  The cause of this condition is not known.  There is no cure for this condition, but treatment can help you manage your symptoms. This information is not intended to replace advice given to you by your health care provider. Make sure you discuss any questions you have with your health care provider. Document Revised: 11/30/2017 Document Reviewed: 11/30/2017 Elsevier Patient Education  2020 [employer].  COVID-19 vaccine recommendations:   COVID-19 vaccine is recommended for everyone (unless you are allergic to a vaccine component), even if you are on a medication that suppresses your immune system.   Do not take Tylenol or any anti-inflammatory medications (NSAIDs) 24 hours prior to the COVID-19 vaccination.   There is no direct evidence about the efficacy of the COVID-19 vaccine in individuals who are on medications that suppress the immune system.   Even if you are fully vaccinated, and you are on any medications that suppress your immune system, please continue to wear a mask, maintain at least six feet social distance and practice hand hygiene.   If you develop a COVID-19 infection, please contact your PCP or our office to determine if you need monoclonal antibody infusion.  The booster vaccine is now available for immunocompromised patients.   Please see the following web sites for updated information.   [email or web address]

## 2020-01-13 ENCOUNTER — Ambulatory Visit: Payer: BC Managed Care – PPO | Admitting: Pulmonary Disease

## 2020-01-13 ENCOUNTER — Encounter: Payer: Self-pay | Admitting: Pulmonary Disease

## 2020-01-13 ENCOUNTER — Other Ambulatory Visit: Payer: Self-pay

## 2020-01-13 VITALS — BP 132/66 | HR 79 | Temp 98.3°F | Ht 63.0 in | Wt 242.0 lb

## 2020-01-13 DIAGNOSIS — J849 Interstitial pulmonary disease, unspecified: Secondary | ICD-10-CM

## 2020-01-13 DIAGNOSIS — M3500 Sicca syndrome, unspecified: Secondary | ICD-10-CM | POA: Diagnosis not present

## 2020-01-13 DIAGNOSIS — R0600 Dyspnea, unspecified: Secondary | ICD-10-CM | POA: Diagnosis not present

## 2020-01-13 LAB — CBC WITH DIFFERENTIAL/PLATELET
Basophils Absolute: 0 10*3/uL (ref 0.0–0.1)
Basophils Relative: 0.7 % (ref 0.0–3.0)
Eosinophils Absolute: 0.1 10*3/uL (ref 0.0–0.7)
Eosinophils Relative: 1.8 % (ref 0.0–5.0)
HCT: 30.7 % — ABNORMAL LOW (ref 36.0–46.0)
Hemoglobin: 9.6 g/dL — ABNORMAL LOW (ref 12.0–15.0)
Lymphocytes Relative: 28.3 % (ref 12.0–46.0)
Lymphs Abs: 2 10*3/uL (ref 0.7–4.0)
MCHC: 31.3 g/dL (ref 30.0–36.0)
MCV: 73.9 fl — ABNORMAL LOW (ref 78.0–100.0)
Monocytes Absolute: 0.5 10*3/uL (ref 0.1–1.0)
Monocytes Relative: 7.7 % (ref 3.0–12.0)
Neutro Abs: 4.4 10*3/uL (ref 1.4–7.7)
Neutrophils Relative %: 61.5 % (ref 43.0–77.0)
Platelets: 353 10*3/uL (ref 150.0–400.0)
RBC: 4.15 Mil/uL (ref 3.87–5.11)
RDW: 19.2 % — ABNORMAL HIGH (ref 11.5–15.5)
WBC: 7.1 10*3/uL (ref 4.0–10.5)

## 2020-01-13 NOTE — Patient Instructions (Signed)
We will get some labs today including CBC differential, IgE, alpha-1 antitrypsin levels and phenotype and hypersensitivity profile We will schedule high-resolution CT and pulmonary function test Follow-up in clinic in 1 to 2 months for reevaluation.

## 2020-01-13 NOTE — Progress Notes (Addendum)
Natasha Arias    [id number]    [birth year]  Primary Care Physician:Santiago, Meda Coffee, MD (Inactive)  Referring Physician: Pollyann Savoy, MD [address] [place],  [place] [address]  Chief complaint: Consult for dyspnea, rule out ILD, COPD  HPI: 54 year old ex-smoker with recent diagnosis of Sjogren's.  She has followed up with Dr. Corliss Skains and pilocarpine, hydroxychloroquine is being considered.  There is also question of asthma and she has been sent to pulmonary for evaluation.  Complains of chronic dyspnea on exertion which is worsening since she quit smoking in 2019.  Is also associated with weight gain over the past 2 years after smoking cessation.  Reports that she is unable to walk down the block without having to stop.  Denies any cough, mucus production, congestion.  Occasional wheezing noted.   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  She has goose down pillows and comforters for as long as she can remember Smoking history: 25-pack-year smoker.  Quit in 2019 Travel history: Originally from [place].  No significant recent travel Relevant family history: No significant family history of lung disease  No outpatient encounter medications on file as of 01/13/2020.   No facility-administered encounter medications on file as of 01/13/2020.    Allergies as of 01/13/2020   (No Known Allergies)    Past Medical History:  Diagnosis Date   ADHD (attention deficit hyperactivity disorder), inattentive type    Anemia    Depression    GERD (gastroesophageal reflux disease)    IBS (irritable bowel syndrome)     History reviewed. No pertinent surgical history.  Family History  Problem Relation Age of Onset   Diabetes Mother    Heart disease Mother    Hyperlipidemia Mother    Hypertension Mother    Stroke Mother    Diabetes Father    Heart disease Father     Hyperlipidemia Father    Mental illness Father    Hypertension Brother     Social History   Socioeconomic History   Marital status: Single    Spouse name: Not on file   Number of children: Not on file   Years of education: Not on file   Highest education level: Not on file  Occupational History   Not on file  Tobacco Use   Smoking status: Former Smoker    Packs/day: 1.00    Years: 25.00    Pack years: 25.00    Types: Cigarettes    Quit date: 09/12/2017    Years since quitting: 2.3   Smokeless tobacco: Never Used  Vaping Use   Vaping Use: Never used  Substance and Sexual Activity   Alcohol use: Never   Drug use: Never   Sexual activity: Not on file  Other Topics Concern   Not on file  Social History Narrative   ** Merged History Encounter **       Social Determinants of Health   Financial Resource Strain:    Difficulty of Paying Living Expenses: Not on file  Food Insecurity:    Worried About Programme researcher, broadcasting/film/video in the Last Year: Not on file   [employer] of Food in the Last Year: Not on file  Transportation Needs:    Lack of Transportation (Medical): Not on file   Lack of Transportation (Non-Medical): Not on file  Physical Activity:    Days of Exercise per  Week: Not on file   Minutes of Exercise per Session: Not on file  Stress:    Feeling of Stress : Not on file  Social Connections:    Frequency of Communication with Friends and Family: Not on file   Frequency of Social Gatherings with Friends and Family: Not on file   Attends Religious Services: Not on file   Active Member of Clubs or Organizations: Not on file   Attends Banker Meetings: Not on file   Marital Status: Not on file  Intimate Partner Violence:    Fear of Current or Ex-Partner: Not on file   Emotionally Abused: Not on file   Physically Abused: Not on file   Sexually Abused: Not on file    Review of systems: Review of Systems  Constitutional: Negative for fever and chills.  HENT:  Negative.   Eyes: Negative for blurred vision.  Respiratory: as per HPI  Cardiovascular: Negative for chest pain and palpitations.  Gastrointestinal: Negative for vomiting, diarrhea, blood per rectum. Genitourinary: Negative for dysuria, urgency, frequency and hematuria.  Musculoskeletal: Negative for myalgias, back pain and joint pain.  Skin: Negative for itching and rash.  Neurological: Negative for dizziness, tremors, focal weakness, seizures and loss of consciousness.  Endo/Heme/Allergies: Negative for environmental allergies.  Psychiatric/Behavioral: Negative for depression, suicidal ideas and hallucinations.  All other systems reviewed and are negative.  Physical Exam: Blood pressure 132/66, pulse 79, temperature 98.3 F (36.8 C), temperature source Temporal, height 5\' 3"  (1.6 m), weight 242 lb (109.8 kg), SpO2 97 %. Gen:      No acute distress HEENT:  EOMI, sclera anicteric Neck:     No masses; no thyromegaly Lungs:    Clear to auscultation bilaterally; normal respiratory effort CV:         Regular rate and rhythm; no murmurs Abd:      + bowel sounds; soft, non-tender; no palpable masses, no distension Ext:    No edema; adequate peripheral perfusion Skin:      Warm and dry; no rash Neuro: alert and oriented x 3 Psych: normal mood and affect  Data Reviewed: Imaging: Chest x-ray 11/19/2019-no acute cardiopulmonary abnormality.  I have reviewed the images personally.  PFTs:  Labs: CBC 06/21/2019-WBC 6.2, eos 1%, WBC 4.62  Assessment:  Consult for ILD, dyspnea She has multiple possible etiologies for dyspnea including COPD given smoking history, asthma and she has a wheeze, ILD from Sjogren's or hypersensitivity pneumonitis due to down exposure.  We will get baseline labs today including CBC with differential, IgE, alpha-1 antitrypsin, hypersensitivity pneumonitis profile Schedule pulmonary function test and high-res CT for better evaluation of the lungs  Follow-up in  clinic in 1 month for review of results and plan for next steps.  Plan/Recommendations: CBC IgE alpha-1 antitrypsin, hypersensitivity panel High-res CT, PFTs  06/23/2019 MD [facility] 01/13/2020, 8:45 AM  CC: 14/07/2019, MD  Addendum: Received note from gynecologist, Pollyann Savoy MD dated 07/02/2020 Routine gynecologic exam, evaluation for anemia.  Repeat hemoglobin is 10.9 on fingerstick, CBC ordered Patient is scheduled to have her breast implants out to see if that will help with her chronic fatigue.  07/04/2020 MD [facility] 08/05/2020, 9:33 AM

## 2020-01-16 ENCOUNTER — Ambulatory Visit (INDEPENDENT_AMBULATORY_CARE_PROVIDER_SITE_OTHER): Payer: BC Managed Care – PPO

## 2020-01-16 ENCOUNTER — Other Ambulatory Visit: Payer: Self-pay

## 2020-01-16 DIAGNOSIS — R918 Other nonspecific abnormal finding of lung field: Secondary | ICD-10-CM | POA: Diagnosis not present

## 2020-01-16 DIAGNOSIS — J439 Emphysema, unspecified: Secondary | ICD-10-CM

## 2020-01-16 DIAGNOSIS — J849 Interstitial pulmonary disease, unspecified: Secondary | ICD-10-CM | POA: Diagnosis not present

## 2020-01-17 LAB — HYPERSENSITIVITY PNEUMONITIS
A. Pullulans Abs: NEGATIVE
A.Fumigatus #1 Abs: NEGATIVE
Micropolyspora faeni, IgG: NEGATIVE
Pigeon Serum Abs: NEGATIVE
Thermoact. Saccharii: NEGATIVE
Thermoactinomyces vulgaris, IgG: NEGATIVE

## 2020-01-27 ENCOUNTER — Telehealth: Payer: Self-pay | Admitting: Pulmonary Disease

## 2020-01-27 NOTE — Telephone Encounter (Signed)
Labs show reduced hemoglobin of 9.6 compared to 7 months ago. If no obvious signs of bleeding then please follow-up with your primary care to get this worked up. -------------  Spoke with pt, aware of results/recs.  Nothing further needed.

## 2020-01-30 NOTE — Progress Notes (Deleted)
Office Visit Note  Patient: Natasha Arias             Date of Birth: 09-Mar-1965           MRN: [id number]             PCP: Lezlie Lye, Meda Coffee, MD Referring: No ref. provider found Visit Date: 02/13/2020 Occupation: @GUAROCC @  Subjective:  No chief complaint on file.   History of Present Illness: Natasha Arias is a 53 y.o. female ***   Activities of Daily Living:  Patient reports morning stiffness for *** {minute/hour:19697}.   Patient {ACTIONS;DENIES/REPORTS:21021675::"Denies"} nocturnal pain.  Difficulty dressing/grooming: {ACTIONS;DENIES/REPORTS:21021675::"Denies"} Difficulty climbing stairs: {ACTIONS;DENIES/REPORTS:21021675::"Denies"} Difficulty getting out of chair: {ACTIONS;DENIES/REPORTS:21021675::"Denies"} Difficulty using hands for taps, buttons, cutlery, and/or writing: {ACTIONS;DENIES/REPORTS:21021675::"Denies"}  No Rheumatology ROS completed.   PMFS History:  Patient Active Problem List   Diagnosis Date Noted  . Sjogren's syndrome with keratoconjunctivitis sicca (HCC) 12/09/2019  . Fibromyalgia 12/09/2019  . RECTAL PAIN 01/21/2010  . GERD 10/09/2008  . RECTAL BLEEDING 10/09/2008  . ABDOMINAL BLOATING 10/09/2008  . RUQ PAIN 10/09/2008  . HEMORRHOIDS 10/03/2008    Past Medical History:  Diagnosis Date  . ADHD (attention deficit hyperactivity disorder), inattentive type   . Anemia   . Depression   . GERD (gastroesophageal reflux disease)   . IBS (irritable bowel syndrome)     Family History  Problem Relation Age of Onset  . Diabetes Mother   . Heart disease Mother   . Hyperlipidemia Mother   . Hypertension Mother   . Stroke Mother   . Diabetes Father   . Heart disease Father   . Hyperlipidemia Father   . Mental illness Father   . Hypertension Brother    No past surgical history on file. Social History   Social History Narrative   ** Merged History Encounter **       Immunization History  Administered Date(s) Administered  .  Janssen (J&J) SARS-COV-2 Vaccination 04/21/2019  . Td 06/25/2019     Objective: Vital Signs: There were no vitals taken for this visit.   Physical Exam   Musculoskeletal Exam: ***  CDAI Exam: CDAI Score: -- Patient Global: --; Provider Global: -- Swollen: --; Tender: -- Joint Exam 02/13/2020   No joint exam has been documented for this visit   There is currently no information documented on the homunculus. Go to the Rheumatology activity and complete the homunculus joint exam.  Investigation: No additional findings.  Imaging: CT Chest High Resolution  Result Date: 01/17/2020 CLINICAL DATA:  Interstitial lung disease, Sjogren syndrome. Chronic shortness of breath. Ex-smoker, quit 2 years ago. EXAM: CT CHEST WITHOUT CONTRAST TECHNIQUE: Multidetector CT imaging of the chest was performed following the standard protocol without intravenous contrast. High resolution imaging of the lungs, as well as inspiratory and expiratory imaging, was performed. COMPARISON:  None. FINDINGS: Cardiovascular: Atherosclerotic calcification of the aorta. Heart size normal. No pericardial effusion. Mediastinum/Nodes: Mediastinal and axillary lymph nodes are not enlarged by CT size criteria. Hilar regions are difficult to definitively evaluate without IV contrast. Esophagus is grossly unremarkable. Lungs/Pleura: Mild centrilobular emphysema. Mild peripheral subpleural ground-glass and reticular densities in the lateral midlung zones. Millimetric nodules in the upper lobes measure up to 2 mm in the left upper lobe (5/27). Lungs are otherwise clear. No pleural fluid. Airway is unremarkable. No air trapping. Upper Abdomen: Visualized portion of the liver is decreased in attenuation diffusely. Visualized portions of the liver, adrenal glands, kidneys, spleen, pancreas and stomach  are otherwise unremarkable. Musculoskeletal: Degenerative changes in the spine. No worrisome lytic or sclerotic lesions. IMPRESSION: 1.  Subtle pattern of midlung zone subpleural ground-glass and reticular densities, findings which can be seen with nonspecific interstitial pneumonitis. Findings are indeterminate for UIP per consensus guidelines: Diagnosis of Idiopathic Pulmonary Fibrosis: An Official ATS/ERS/JRS/ALAT Clinical Practice Guideline. Am Rosezetta Schlatter Crit Care Med Vol 198, Iss 5, [phone], Oct 08 2016. 2. Millimetric pulmonary nodules. Consider annual lung cancer screening CT, if clinically appropriate. Otherwise, no follow-up needed if patient is low-risk (and has no known or suspected primary neoplasm). Non-contrast chest CT can be considered in 12 months if patient is high-risk. This recommendation follows the consensus statement: Guidelines for Management of Incidental Pulmonary Nodules Detected on CT Images: From the Fleischner Society 2017; Radiology 2017; 284:228-243. 3. Hepatic steatosis. 4.  Aortic atherosclerosis (ICD10-I70.0). 5.  Emphysema (ICD10-J43.9). Electronically Signed   By: Leanna Battles M.D.   On: 01/17/2020 08:27    Recent Labs: Lab Results  Component Value Date   WBC 7.1 01/13/2020   HGB 9.6 (L) 01/13/2020   PLT 353.0 01/13/2020   NA 140 11/14/2019   K 4.2 11/14/2019   CL 105 11/14/2019   CO2 26 11/14/2019   GLUCOSE 94 11/14/2019   BUN 13 11/14/2019   CREATININE 0.92 11/14/2019   BILITOT 0.6 11/14/2019   ALKPHOS 141 (H) 06/21/2019   AST 19 11/14/2019   ALT 23 11/14/2019   PROT 7.0 11/14/2019   PROT 7.2 11/14/2019   ALBUMIN 4.5 06/21/2019   CALCIUM 9.3 11/14/2019   GFRAA 81 11/14/2019    Speciality Comments: No specialty comments available.  Procedures:  No procedures performed Allergies: Patient has no known allergies.   Assessment / Plan:     Visit Diagnoses: No diagnosis found.  Orders: No orders of the defined types were placed in this encounter.  No orders of the defined types were placed in this encounter.   Face-to-face time spent with patient was *** minutes. Greater  than 50% of time was spent in counseling and coordination of care.  Follow-Up Instructions: No follow-ups on file.   Ellen Henri, CMA  Note - This record has been created using Animal nutritionist.  Chart creation errors have been sought, but may not always  have been located. Such creation errors do not reflect on  the standard of medical care.

## 2020-02-01 LAB — ALPHA-1 ANTITRYPSIN PHENOTYPE: A-1 Antitrypsin, Ser: 162 mg/dL (ref 83–199)

## 2020-02-01 LAB — IGE: IgE (Immunoglobulin E), Serum: 18 kU/L (ref <=114)

## 2020-02-13 ENCOUNTER — Ambulatory Visit: Payer: BC Managed Care – PPO | Admitting: Rheumatology

## 2020-02-25 ENCOUNTER — Ambulatory Visit: Payer: BC Managed Care – PPO | Admitting: Pulmonary Disease

## 2020-03-11 NOTE — Progress Notes (Deleted)
Office Visit Note  Patient: Natasha Arias             Date of Birth: [birth year]           MRN: [id number]             PCP: Lezlie Lye, Meda Coffee, MD Referring: Lezlie Lye, Meda Coffee, * Visit Date: 03/25/2020 Occupation: @GUAROCC @  Subjective:  No chief complaint on file.   History of Present Illness: Natasha Arias is a 55 y.o. female ***   Activities of Daily Living:  Patient reports morning stiffness for *** {minute/hour:19697}.   Patient {ACTIONS;DENIES/REPORTS:21021675::"Denies"} nocturnal pain.  Difficulty dressing/grooming: {ACTIONS;DENIES/REPORTS:21021675::"Denies"} Difficulty climbing stairs: {ACTIONS;DENIES/REPORTS:21021675::"Denies"} Difficulty getting out of chair: {ACTIONS;DENIES/REPORTS:21021675::"Denies"} Difficulty using hands for taps, buttons, cutlery, and/or writing: {ACTIONS;DENIES/REPORTS:21021675::"Denies"}  No Rheumatology ROS completed.   PMFS History:  Patient Active Problem List   Diagnosis Date Noted  . Sjogren's syndrome with keratoconjunctivitis sicca (HCC) 12/09/2019  . Fibromyalgia 12/09/2019  . RECTAL PAIN 01/21/2010  . GERD 10/09/2008  . RECTAL BLEEDING 10/09/2008  . ABDOMINAL BLOATING 10/09/2008  . RUQ PAIN 10/09/2008  . HEMORRHOIDS 10/03/2008    Past Medical History:  Diagnosis Date  . ADHD (attention deficit hyperactivity disorder), inattentive type   . Anemia   . Depression   . GERD (gastroesophageal reflux disease)   . IBS (irritable bowel syndrome)     Family History  Problem Relation Age of Onset  . Diabetes Mother   . Heart disease Mother   . Hyperlipidemia Mother   . Hypertension Mother   . Stroke Mother   . Diabetes Father   . Heart disease Father   . Hyperlipidemia Father   . Mental illness Father   . Hypertension Brother    No past surgical history on file. Social History   Social History Narrative   ** Merged History Encounter **       Immunization History  Administered Date(s) Administered   . Janssen (J&J) SARS-COV-2 Vaccination 04/21/2019  . Td 06/25/2019     Objective: Vital Signs: There were no vitals taken for this visit.   Physical Exam   Musculoskeletal Exam: ***  CDAI Exam: CDAI Score: -- Patient Global: --; Provider Global: -- Swollen: --; Tender: -- Joint Exam 03/25/2020   No joint exam has been documented for this visit   There is currently no information documented on the homunculus. Go to the Rheumatology activity and complete the homunculus joint exam.  Investigation: No additional findings.  Imaging: No results found.  Recent Labs: Lab Results  Component Value Date   WBC 7.1 01/13/2020   HGB 9.6 (L) 01/13/2020   PLT 353.0 01/13/2020   NA 140 11/14/2019   K 4.2 11/14/2019   CL 105 11/14/2019   CO2 26 11/14/2019   GLUCOSE 94 11/14/2019   BUN 13 11/14/2019   CREATININE 0.92 11/14/2019   BILITOT 0.6 11/14/2019   ALKPHOS 141 (H) 06/21/2019   AST 19 11/14/2019   ALT 23 11/14/2019   PROT 7.0 11/14/2019   PROT 7.2 11/14/2019   ALBUMIN 4.5 06/21/2019   CALCIUM 9.3 11/14/2019   GFRAA 81 11/14/2019    Speciality Comments: No specialty comments available.  Procedures:  No procedures performed Allergies: Patient has no known allergies.   Assessment / Plan:     Visit Diagnoses: No diagnosis found.  Orders: No orders of the defined types were placed in this encounter.  No orders of the defined types were placed in this encounter.   Face-to-face time  spent with patient was *** minutes. Greater than 50% of time was spent in counseling and coordination of care.  Follow-Up Instructions: No follow-ups on file.   Earnestine Mealing, CMA  Note - This record has been created using Editor, commissioning.  Chart creation errors have been sought, but may not always  have been located. Such creation errors do not reflect on  the standard of medical care.

## 2020-03-25 ENCOUNTER — Ambulatory Visit: Payer: BC Managed Care – PPO | Admitting: Rheumatology

## 2020-03-25 DIAGNOSIS — R0602 Shortness of breath: Secondary | ICD-10-CM

## 2020-03-25 DIAGNOSIS — Z9882 Breast implant status: Secondary | ICD-10-CM

## 2020-03-25 DIAGNOSIS — M797 Fibromyalgia: Secondary | ICD-10-CM

## 2020-03-25 DIAGNOSIS — Z8719 Personal history of other diseases of the digestive system: Secondary | ICD-10-CM

## 2020-03-25 DIAGNOSIS — M3501 Sicca syndrome with keratoconjunctivitis: Secondary | ICD-10-CM

## 2020-03-25 DIAGNOSIS — G8929 Other chronic pain: Secondary | ICD-10-CM

## 2020-03-25 DIAGNOSIS — R5383 Other fatigue: Secondary | ICD-10-CM

## 2020-04-16 ENCOUNTER — Ambulatory Visit: Payer: BC Managed Care – PPO | Admitting: Pulmonary Disease

## 2020-05-15 NOTE — Progress Notes (Deleted)
Office Visit Note  Patient: Dareen Gutzwiller             Date of Birth: April 23, 1965           MRN: [id number]             PCP: Lezlie Lye, Meda Coffee, MD Referring: Lezlie Lye, Meda Coffee, * Visit Date: 05/29/2020 Occupation: @GUAROCC @  Subjective:  No chief complaint on file.   History of Present Illness: Natasha Arias is a 55 y.o. female ***   Activities of Daily Living:  Patient reports morning stiffness for *** {minute/hour:19697}.   Patient {ACTIONS;DENIES/REPORTS:21021675::"Denies"} nocturnal pain.  Difficulty dressing/grooming: {ACTIONS;DENIES/REPORTS:21021675::"Denies"} Difficulty climbing stairs: {ACTIONS;DENIES/REPORTS:21021675::"Denies"} Difficulty getting out of chair: {ACTIONS;DENIES/REPORTS:21021675::"Denies"} Difficulty using hands for taps, buttons, cutlery, and/or writing: {ACTIONS;DENIES/REPORTS:21021675::"Denies"}  No Rheumatology ROS completed.   PMFS History:  Patient Active Problem List   Diagnosis Date Noted  . Sjogren's syndrome with keratoconjunctivitis sicca (HCC) 12/09/2019  . Fibromyalgia 12/09/2019  . RECTAL PAIN 01/21/2010  . GERD 10/09/2008  . RECTAL BLEEDING 10/09/2008  . ABDOMINAL BLOATING 10/09/2008  . RUQ PAIN 10/09/2008  . HEMORRHOIDS 10/03/2008    Past Medical History:  Diagnosis Date  . ADHD (attention deficit hyperactivity disorder), inattentive type   . Anemia   . Depression   . GERD (gastroesophageal reflux disease)   . IBS (irritable bowel syndrome)     Family History  Problem Relation Age of Onset  . Diabetes Mother   . Heart disease Mother   . Hyperlipidemia Mother   . Hypertension Mother   . Stroke Mother   . Diabetes Father   . Heart disease Father   . Hyperlipidemia Father   . Mental illness Father   . Hypertension Brother    No past surgical history on file. Social History   Social History Narrative   ** Merged History Encounter **       Immunization History  Administered Date(s) Administered   . Janssen (J&J) SARS-COV-2 Vaccination 04/21/2019  . Td 06/25/2019     Objective: Vital Signs: There were no vitals taken for this visit.   Physical Exam   Musculoskeletal Exam: ***  CDAI Exam: CDAI Score: -- Patient Global: --; Provider Global: -- Swollen: --; Tender: -- Joint Exam 05/29/2020   No joint exam has been documented for this visit   There is currently no information documented on the homunculus. Go to the Rheumatology activity and complete the homunculus joint exam.  Investigation: No additional findings.  Imaging: No results found.  Recent Labs: Lab Results  Component Value Date   WBC 7.1 01/13/2020   HGB 9.6 (L) 01/13/2020   PLT 353.0 01/13/2020   NA 140 11/14/2019   K 4.2 11/14/2019   CL 105 11/14/2019   CO2 26 11/14/2019   GLUCOSE 94 11/14/2019   BUN 13 11/14/2019   CREATININE 0.92 11/14/2019   BILITOT 0.6 11/14/2019   ALKPHOS 141 (H) 06/21/2019   AST 19 11/14/2019   ALT 23 11/14/2019   PROT 7.0 11/14/2019   PROT 7.2 11/14/2019   ALBUMIN 4.5 06/21/2019   CALCIUM 9.3 11/14/2019   GFRAA 81 11/14/2019    Speciality Comments: No specialty comments available.  Procedures:  No procedures performed Allergies: Patient has no known allergies.   Assessment / Plan:     Visit Diagnoses: No diagnosis found.  Orders: No orders of the defined types were placed in this encounter.  No orders of the defined types were placed in this encounter.   Face-to-face time  spent with patient was *** minutes. Greater than 50% of time was spent in counseling and coordination of care.  Follow-Up Instructions: No follow-ups on file.   Earnestine Mealing, CMA  Note - This record has been created using Editor, commissioning.  Chart creation errors have been sought, but may not always  have been located. Such creation errors do not reflect on  the standard of medical care.

## 2020-05-21 NOTE — Telephone Encounter (Signed)
Opened in error

## 2020-05-29 ENCOUNTER — Ambulatory Visit: Payer: BC Managed Care – PPO | Admitting: Rheumatology

## 2020-05-29 DIAGNOSIS — G8929 Other chronic pain: Secondary | ICD-10-CM

## 2020-05-29 DIAGNOSIS — M3501 Sicca syndrome with keratoconjunctivitis: Secondary | ICD-10-CM

## 2020-05-29 DIAGNOSIS — R5383 Other fatigue: Secondary | ICD-10-CM

## 2020-05-29 DIAGNOSIS — M797 Fibromyalgia: Secondary | ICD-10-CM

## 2020-05-29 DIAGNOSIS — Z9882 Breast implant status: Secondary | ICD-10-CM

## 2020-05-29 DIAGNOSIS — Z6841 Body Mass Index (BMI) 40.0 and over, adult: Secondary | ICD-10-CM

## 2020-05-29 DIAGNOSIS — Z8719 Personal history of other diseases of the digestive system: Secondary | ICD-10-CM

## 2020-05-29 DIAGNOSIS — R0602 Shortness of breath: Secondary | ICD-10-CM

## 2020-06-26 ENCOUNTER — Encounter: Payer: Commercial Managed Care - PPO | Admitting: Family Medicine

## 2022-04-23 IMAGING — US US BREAST*L* LIMITED INC AXILLA
1 series · 5 of 5 positions shown · non-contrast
Comparison: Previous exam(s).

CLINICAL DATA: Patient recalled from screening for left breast
mass.

EXAM:
DIGITAL DIAGNOSTIC LEFT MAMMOGRAM WITH CAD AND TOMO
ULTRASOUND LEFT BREAST

[Series 1: us breast*left* limited inc axilla · 0.06mm/px · 5 of 5 slices shown]
[im 1/5]
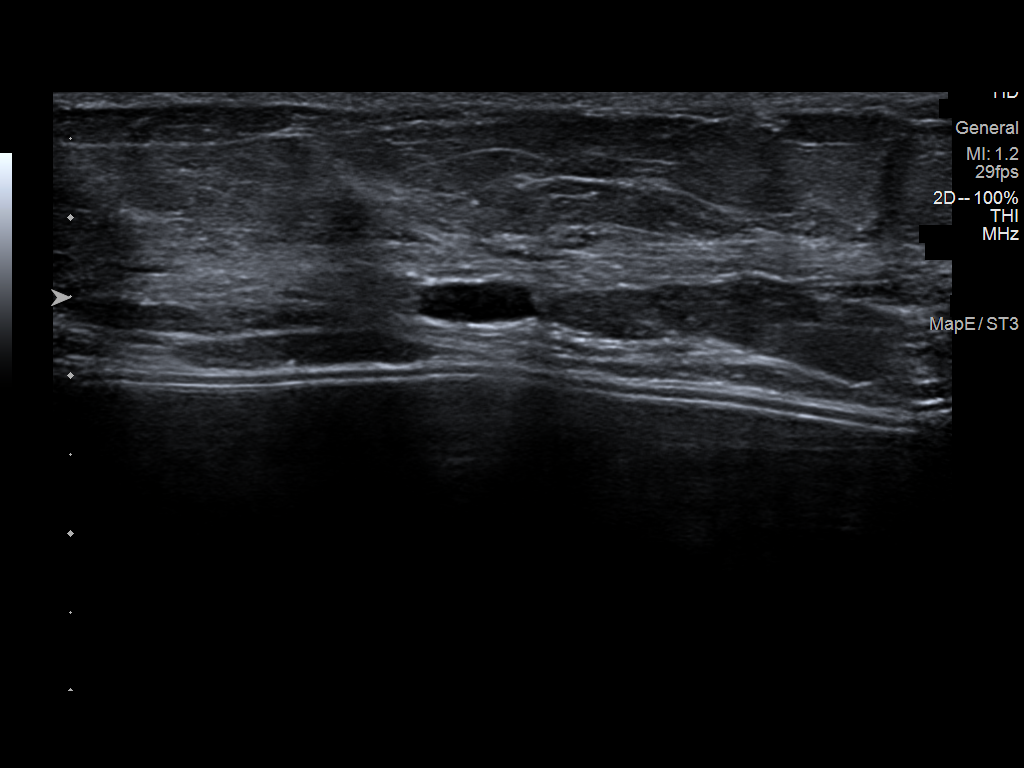
[im 2/5]
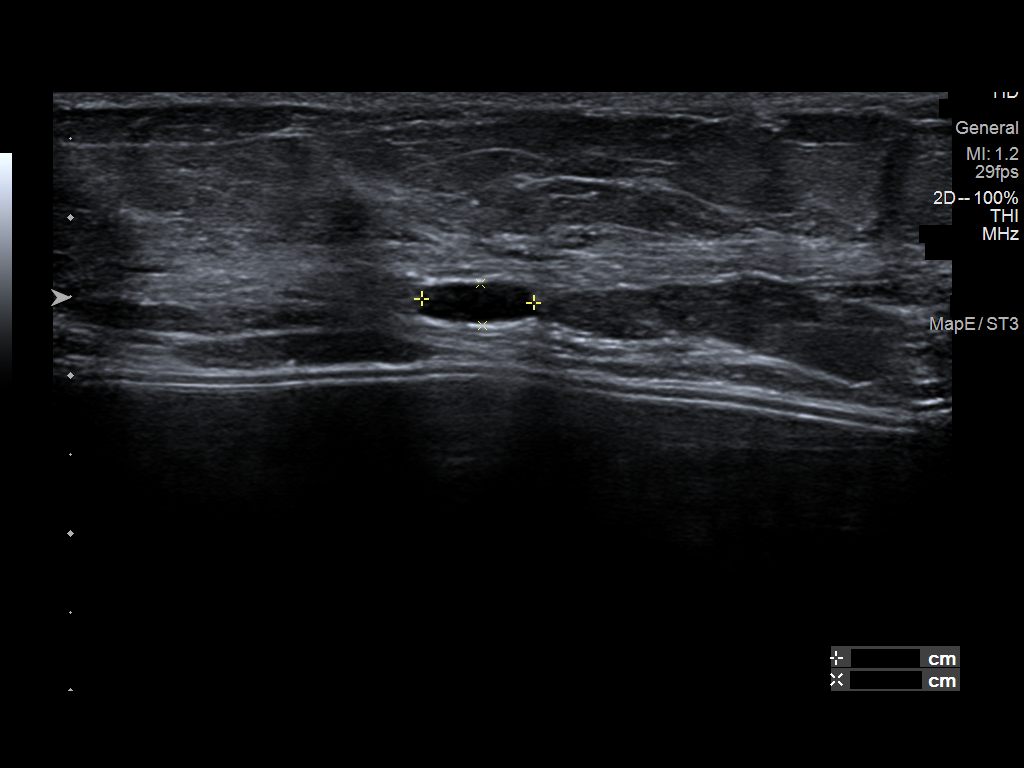
[im 3/5]
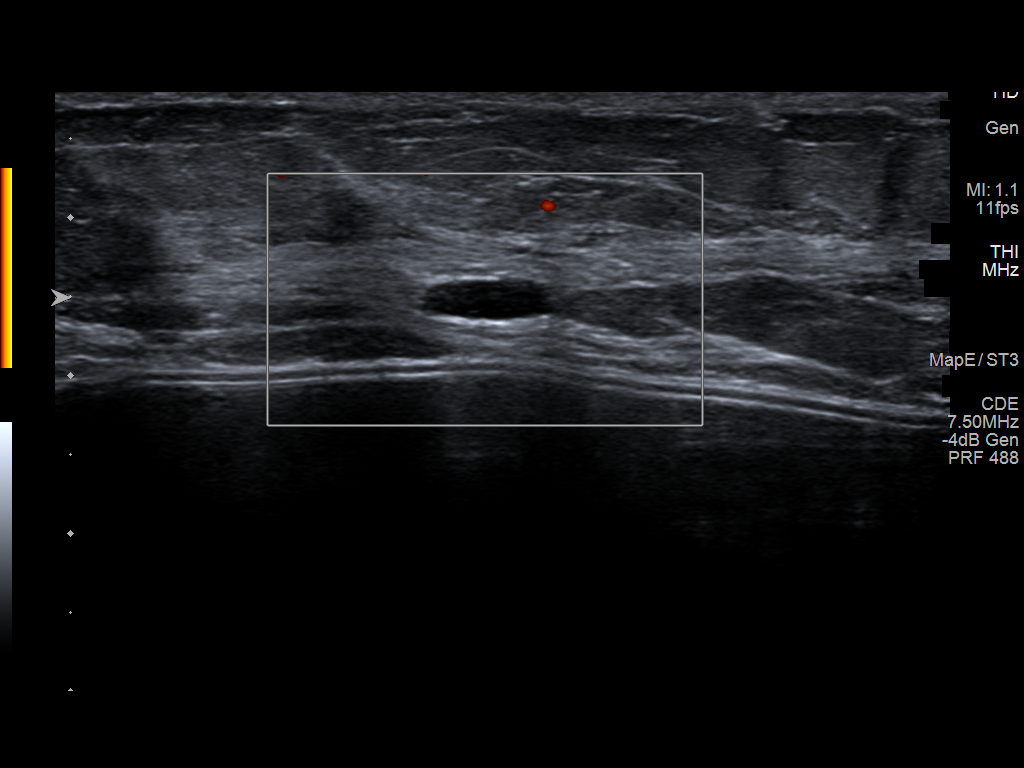
[im 4/5]
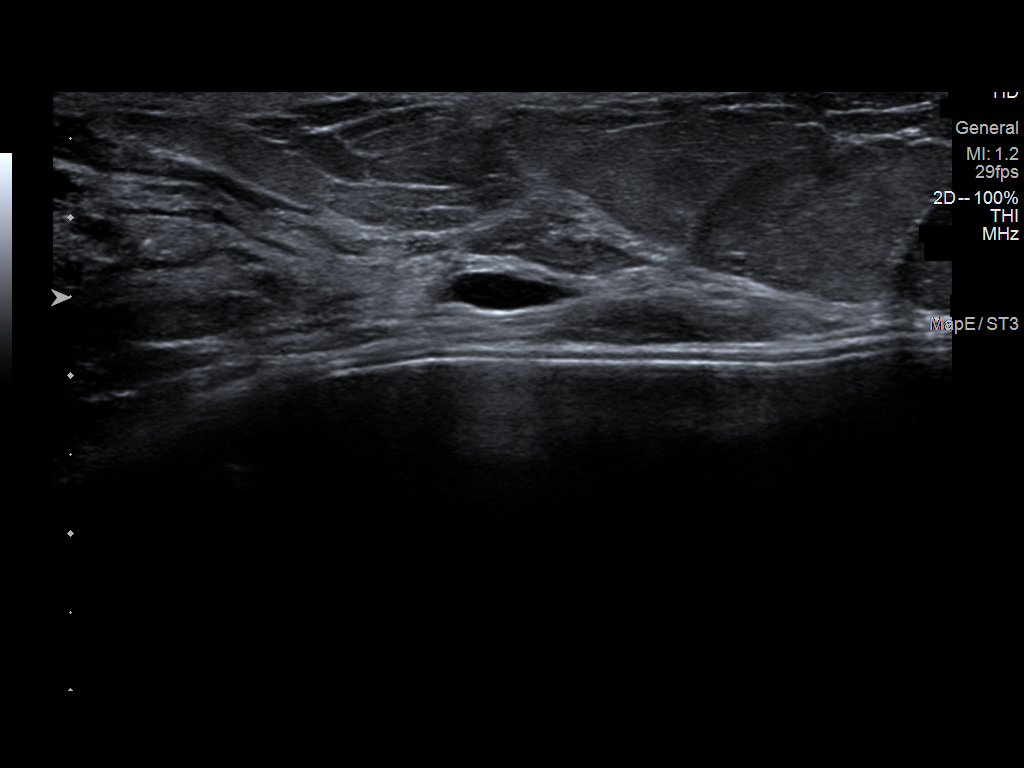
[im 5/5]
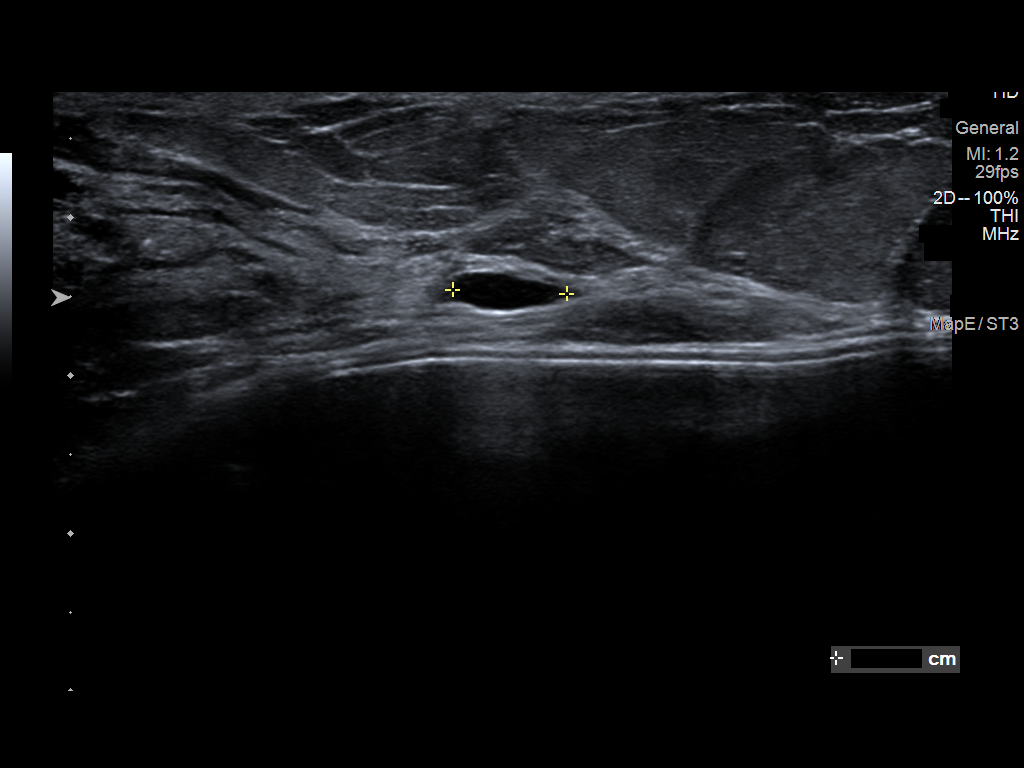

[5 of 5 positions shown; findings below may reference images not displayed]

ACR Breast Density Category c: The breast tissue is heterogeneously
dense, which may obscure small masses.
FINDINGS: There is a persistent oval circumscribed low-density mass within the
inferior left breast middle to posterior depth, further evaluated
with spot compression views.

Mammographic images were processed with CAD.

Targeted ultrasound is performed, showing a 7 x 7 x 3 mm cyst left
breast 5 o'clock position 2 cm from nipple.
IMPRESSION: Left breast cyst.  No mammographic evidence for malignancy.

RECOMMENDATION:
Screening mammogram in one year.(Code:[id number])

I have discussed the findings and recommendations with the patient.
If applicable, a reminder letter will be sent to the patient
regarding the next appointment.

BI-RADS CATEGORY  2: Benign.
